# Patient Record
Sex: Female | Born: 1970 | Race: Black or African American | Hispanic: No | Marital: Single | Smoking: Current every day smoker
Health system: Southern US, Community
[De-identification: ages and names within clinical notes are randomized; demographics above are authoritative.]

## PROBLEM LIST (undated history)

## (undated) DIAGNOSIS — D219 Benign neoplasm of connective and other soft tissue, unspecified: Secondary | ICD-10-CM

## (undated) DIAGNOSIS — T50901A Poisoning by unspecified drugs, medicaments and biological substances, accidental (unintentional), initial encounter: Secondary | ICD-10-CM

## (undated) DIAGNOSIS — F319 Bipolar disorder, unspecified: Secondary | ICD-10-CM

## (undated) DIAGNOSIS — F32A Depression, unspecified: Secondary | ICD-10-CM

## (undated) DIAGNOSIS — F329 Major depressive disorder, single episode, unspecified: Secondary | ICD-10-CM

## (undated) DIAGNOSIS — F419 Anxiety disorder, unspecified: Secondary | ICD-10-CM

## (undated) DIAGNOSIS — N390 Urinary tract infection, site not specified: Secondary | ICD-10-CM

## (undated) HISTORY — DX: Benign neoplasm of connective and other soft tissue, unspecified: D21.9

---

## 2008-05-10 ENCOUNTER — Emergency Department (HOSPITAL_COMMUNITY): Admission: EM | Admit: 2008-05-10 | Discharge: 2008-05-10 | Payer: Self-pay | Admitting: Family Medicine

## 2008-07-15 ENCOUNTER — Emergency Department (HOSPITAL_COMMUNITY): Admission: EM | Admit: 2008-07-15 | Discharge: 2008-07-15 | Payer: Self-pay | Admitting: *Deleted

## 2011-04-29 LAB — POCT I-STAT, CHEM 8
Calcium, Ion: 1.14 mmol/L (ref 1.12–1.32)
Creatinine, Ser: 0.8 mg/dL (ref 0.4–1.2)
Glucose, Bld: 95 mg/dL (ref 70–99)
Hemoglobin: 15.3 g/dL — ABNORMAL HIGH (ref 12.0–15.0)
Potassium: 2.7 mEq/L — CL (ref 3.5–5.1)
Sodium: 142 mEq/L (ref 135–145)
TCO2: 22 mmol/L (ref 0–100)

## 2011-04-29 LAB — CBC
HCT: 43.5 % (ref 36.0–46.0)
Hemoglobin: 15.1 g/dL — ABNORMAL HIGH (ref 12.0–15.0)
MCHC: 34.6 g/dL (ref 30.0–36.0)
RBC: 4.49 MIL/uL (ref 3.87–5.11)
WBC: 7.3 10*3/uL (ref 4.0–10.5)

## 2011-04-29 LAB — POCT PREGNANCY, URINE: Preg Test, Ur: NEGATIVE

## 2011-04-29 LAB — SALICYLATE LEVEL: Salicylate Lvl: <4 mg/dL (ref 2.8–20.0)

## 2011-04-29 LAB — DIFFERENTIAL
Basophils Relative: 1 % (ref 0–1)
Lymphs Abs: 1.9 10*3/uL (ref 0.7–4.0)
Monocytes Absolute: 0.6 10*3/uL (ref 0.1–1.0)
Monocytes Relative: 8 % (ref 3–12)
Neutro Abs: 4.7 10*3/uL (ref 1.7–7.7)

## 2011-04-29 LAB — RAPID URINE DRUG SCREEN, HOSP PERFORMED
Amphetamines: NOT DETECTED
Barbiturates: NOT DETECTED
Benzodiazepines: NOT DETECTED
Opiates: NOT DETECTED

## 2011-04-29 LAB — ACETAMINOPHEN LEVEL: Acetaminophen (Tylenol), Serum: <10 ug/mL — ABNORMAL LOW (ref 10–30)

## 2011-04-29 LAB — ETHANOL: Alcohol, Ethyl (B): <5 mg/dL (ref 0–10)

## 2011-04-29 LAB — TRICYCLICS SCREEN, URINE: TCA Scrn: NOT DETECTED

## 2011-11-03 ENCOUNTER — Encounter (HOSPITAL_COMMUNITY): Payer: Self-pay | Admitting: Obstetrics and Gynecology

## 2011-11-03 ENCOUNTER — Inpatient Hospital Stay (HOSPITAL_COMMUNITY)
Admission: AD | Admit: 2011-11-03 | Discharge: 2011-11-03 | Disposition: A | Discharge status: Home / Self Care | Payer: Self-pay | Source: Ambulatory Visit | Attending: Obstetrics and Gynecology | Admitting: Obstetrics and Gynecology

## 2011-11-03 ENCOUNTER — Inpatient Hospital Stay (HOSPITAL_COMMUNITY): Payer: Self-pay

## 2011-11-03 DIAGNOSIS — A599 Trichomoniasis, unspecified: Secondary | ICD-10-CM

## 2011-11-03 DIAGNOSIS — A5901 Trichomonal vulvovaginitis: Secondary | ICD-10-CM | POA: Insufficient documentation

## 2011-11-03 DIAGNOSIS — N938 Other specified abnormal uterine and vaginal bleeding: Secondary | ICD-10-CM | POA: Insufficient documentation

## 2011-11-03 DIAGNOSIS — N390 Urinary tract infection, site not specified: Secondary | ICD-10-CM | POA: Insufficient documentation

## 2011-11-03 DIAGNOSIS — N949 Unspecified condition associated with female genital organs and menstrual cycle: Secondary | ICD-10-CM | POA: Insufficient documentation

## 2011-11-03 HISTORY — DX: Anxiety disorder, unspecified: F41.9

## 2011-11-03 HISTORY — DX: Urinary tract infection, site not specified: N39.0

## 2011-11-03 HISTORY — DX: Poisoning by unspecified drugs, medicaments and biological substances, accidental (unintentional), initial encounter: T50.901A

## 2011-11-03 HISTORY — DX: Bipolar disorder, unspecified: F31.9

## 2011-11-03 HISTORY — DX: Depression, unspecified: F32.A

## 2011-11-03 HISTORY — DX: Major depressive disorder, single episode, unspecified: F32.9

## 2011-11-03 LAB — WET PREP, GENITAL

## 2011-11-03 LAB — CBC
MCHC: 34 g/dL (ref 30.0–36.0)
MCV: 95.2 fL (ref 78.0–100.0)
WBC: 9.3 10*3/uL (ref 4.0–10.5)

## 2011-11-03 LAB — URINE MICROSCOPIC-ADD ON

## 2011-11-03 LAB — POCT PREGNANCY, URINE: Preg Test, Ur: NEGATIVE

## 2011-11-03 LAB — URINALYSIS, ROUTINE W REFLEX MICROSCOPIC
Bilirubin Urine: NEGATIVE
Glucose, UA: NEGATIVE mg/dL
Nitrite: POSITIVE — AB
Urobilinogen, UA: 2 mg/dL — ABNORMAL HIGH (ref 0.0–1.0)

## 2011-11-03 MED ORDER — METRONIDAZOLE 500 MG PO TABS: 500.0000 mg | ORAL_TABLET | Freq: Two times a day (BID) | ORAL | Status: AC

## 2011-11-03 MED ORDER — NITROFURANTOIN MONOHYD MACRO 100 MG PO CAPS: 100.0000 mg | ORAL_CAPSULE | Freq: Two times a day (BID) | ORAL | Status: AC

## 2011-11-03 NOTE — MAU Provider Note (Signed)
History     CSN: [id number]  Arrival date and time: 11/03/11 1450   None     Chief Complaint  Patient presents with  . Dysmenorrhea   HPI Pt is not pregnant and complains of heavy vaginal bleeding for 10 days since 10/24/2011.She has had RCM until now.  She has been changing tampon or pad every 20 mintues to one hour. She has clots. She has a new partner for 3 months.  She had cramping at the beginning but now has back and side pain.  She denies nausea or vomiting.  She denies pain with urination, constipation or diarrhea.  She has Implanon that she has had a long time.  She has been feeling tired and weak.  She has not taken any medications for this.  She is on Seraquel prescribed by [facility].    Past Medical History  Diagnosis Date  . UTI (lower urinary tract infection)   . Bipolar 1 disorder   . Anxiety   . Depression   . Overdose drug   . Genital herpes     Past Surgical History  Procedure Date  . Cesarean section     History reviewed. No pertinent family history.  History  Substance Use Topics  . Smoking status: Current Everyday Smoker  . Smokeless tobacco: Not on file  . Alcohol Use: No    Allergies:  Allergies  Allergen Reactions  . Sulfa Antibiotics Shortness Of Breath and Swelling    Throat swells up    Prescriptions prior to admission  Medication Sig Dispense Refill  . benztropine (COGENTIN) 1 MG tablet Take 1 mg by mouth 2 (two) times daily as needed.      Marland Kitchen QUEtiapine (SEROQUEL) 400 MG tablet Take 400 mg by mouth at bedtime.        Review of Systems  Constitutional: Negative for fever and chills.  Gastrointestinal: Positive for abdominal pain. Negative for nausea, vomiting, diarrhea and constipation.  Genitourinary: Negative for dysuria and urgency.   Physical Exam   Blood pressure 133/94, pulse 97, temperature 98.8 F (37.1 C), temperature source Oral, resp. rate 16, height 5' 2.5" (1.588 m), weight 179 lb 2 oz (81.251 kg), last  menstrual period 10/24/2011.  Physical Exam  Nursing note and vitals reviewed. Constitutional: She appears well-developed and well-nourished.  Eyes: Pupils are equal, round, and reactive to light.  Neck: Normal range of motion. Neck supple.  Respiratory: Effort normal.  GI: Soft. She exhibits no distension. There is no tenderness. There is no rebound and no guarding.  Genitourinary:       Small amount of froth bloody discharge in vault; cervix clean NT; uterus NSSC; adnexa with out palpable enlargement or tenderness  Musculoskeletal: Normal range of motion.  Neurological: She is alert.  Skin: Skin is warm and dry.  Psychiatric: She has a normal mood and affect.    MAU Course  Procedures Results for orders placed during the hospital encounter of 11/03/11 (from the past 24 hour(s))  WET PREP, GENITAL     Status: Abnormal   Collection Time   11/03/11  6:45 PM      Component Value Range   Yeast Wet Prep HPF POC NONE SEEN  NONE SEEN    Trich, Wet Prep FEW (*) NONE SEEN    Clue Cells Wet Prep HPF POC NONE SEEN  NONE SEEN    WBC, Wet Prep HPF POC FEW (*) NONE SEEN   GC/CHLAMYDIA PROBE AMP, GENITAL  Status: Normal   Collection Time   11/03/11  6:45 PM      Component Value Range   GC Probe Amp, Genital NEGATIVE  NEGATIVE    Chlamydia, DNA Probe NEGATIVE  NEGATIVE   Technique: Both transabdominal and transvaginal ultrasound  examinations of the pelvis were performed. Transabdominal technique  was performed for global imaging of the pelvis including uterus,  ovaries, adnexal regions, and pelvic cul-de-sac.  Comparison: None.  It was necessary to proceed with endovaginal exam following the  transabdominal exam to visualize the endometrium and uterus.  Findings:  Uterus: Retroverted uterus is present measuring 82 mm x 43 mm x 61  mm. The uterus is fibroid with a left posterior fundal fibroid  measuring 12 mm x 9 mm x 13 mm.  Endometrium: 4 mm.  Right ovary: 29 mm x 80 mm x 23 mm  with normal physiologic  echotexture.  Left ovary: 38 mm x 24 mm x 22 mm with normal physiologic  echotexture.  Other findings: Small amount of free fluid is present in the  pelvis. This appears simple.  IMPRESSION:  1. Fibroid uterus. Left posterior fundal fibroid maximally  measures 13 mm.  2. Small amount of pelvic free fluid can be associated with  abdominal pain.  Original Report Authenticated By: Andreas Newport, M.D.   Assessment and Plan   Trich - Flagyl BID for 5 days UTI-Macrobid BID for 7 days F/u in GYN clinic for AUB and removal of Implanon Joselynn Amoroso 11/03/2011, 5:29 PM

## 2011-11-03 NOTE — ED Notes (Signed)
Pt not in lobby.  

## 2011-11-03 NOTE — MAU Note (Signed)
Pt states had been bleeding x10 days, notes bilateral flank pain, moreso on her left side. Has seen small blood clots, unsure if they are coming from her bladder or vagina. Wearing tampon at present. Was changing tampon q20 minutes, will seem to subside, then worsen again. Denies abnormal vaginal d/c changes.

## 2011-11-04 LAB — GC/CHLAMYDIA PROBE AMP, GENITAL: GC Probe Amp, Genital: NEGATIVE

## 2011-11-14 NOTE — MAU Provider Note (Signed)
Agree with above note.  Christine Wyatt 11/14/2011 9:43 AM

## 2011-12-12 ENCOUNTER — Encounter: Payer: Self-pay | Admitting: Family

## 2014-02-13 ENCOUNTER — Emergency Department (HOSPITAL_COMMUNITY)
Admission: EM | Admit: 2014-02-13 | Discharge: 2014-02-14 | Disposition: A | Discharge status: Home / Self Care | Payer: Self-pay | Attending: Emergency Medicine | Admitting: Emergency Medicine

## 2014-02-13 ENCOUNTER — Encounter (HOSPITAL_COMMUNITY): Payer: Self-pay | Admitting: Emergency Medicine

## 2014-02-13 DIAGNOSIS — F319 Bipolar disorder, unspecified: Secondary | ICD-10-CM | POA: Insufficient documentation

## 2014-02-13 DIAGNOSIS — N939 Abnormal uterine and vaginal bleeding, unspecified: Secondary | ICD-10-CM

## 2014-02-13 DIAGNOSIS — F17200 Nicotine dependence, unspecified, uncomplicated: Secondary | ICD-10-CM | POA: Insufficient documentation

## 2014-02-13 DIAGNOSIS — Z79899 Other long term (current) drug therapy: Secondary | ICD-10-CM | POA: Insufficient documentation

## 2014-02-13 DIAGNOSIS — N390 Urinary tract infection, site not specified: Secondary | ICD-10-CM | POA: Insufficient documentation

## 2014-02-13 DIAGNOSIS — Z3202 Encounter for pregnancy test, result negative: Secondary | ICD-10-CM | POA: Insufficient documentation

## 2014-02-13 DIAGNOSIS — N898 Other specified noninflammatory disorders of vagina: Secondary | ICD-10-CM | POA: Insufficient documentation

## 2014-02-13 DIAGNOSIS — Z8619 Personal history of other infectious and parasitic diseases: Secondary | ICD-10-CM | POA: Insufficient documentation

## 2014-02-13 LAB — BASIC METABOLIC PANEL
ANION GAP: 15 (ref 5–15)
BUN: 8 mg/dL (ref 6–23)
CHLORIDE: 100 meq/L (ref 96–112)
CO2: 23 meq/L (ref 19–32)
CREATININE: 0.6 mg/dL (ref 0.50–1.10)
Calcium: 9.8 mg/dL (ref 8.4–10.5)
GFR calc Af Amer: >90 mL/min (ref >90)
GFR calc non Af Amer: >90 mL/min (ref >90)
Glucose, Bld: 89 mg/dL (ref 70–99)
Potassium: 4.2 mEq/L (ref 3.7–5.3)
Sodium: 138 mEq/L (ref 137–147)

## 2014-02-13 LAB — URINE MICROSCOPIC-ADD ON

## 2014-02-13 LAB — URINALYSIS, ROUTINE W REFLEX MICROSCOPIC
BILIRUBIN URINE: NEGATIVE
Glucose, UA: NEGATIVE mg/dL
KETONES UR: 15 mg/dL — AB
Nitrite: POSITIVE — AB
PH: 6.5 (ref 5.0–8.0)
Protein, ur: NEGATIVE mg/dL
SPECIFIC GRAVITY, URINE: 1.022 (ref 1.005–1.030)
UROBILINOGEN UA: 1 mg/dL (ref 0.0–1.0)

## 2014-02-13 LAB — I-STAT CHEM 8, ED
BUN: 9 mg/dL (ref 6–23)
Calcium, Ion: 1.03 mmol/L — ABNORMAL LOW (ref 1.12–1.23)
Chloride: 108 mEq/L (ref 96–112)
Creatinine, Ser: 0.7 mg/dL (ref 0.50–1.10)
GLUCOSE: 91 mg/dL (ref 70–99)
HCT: 44 % (ref 36.0–46.0)
HEMOGLOBIN: 15 g/dL (ref 12.0–15.0)
POTASSIUM: 7.1 meq/L — AB (ref 3.7–5.3)
Sodium: 136 mEq/L — ABNORMAL LOW (ref 137–147)
TCO2: 26 mmol/L (ref 0–100)

## 2014-02-13 LAB — POC URINE PREG, ED: Preg Test, Ur: NEGATIVE

## 2014-02-13 LAB — WET PREP, GENITAL
Trich, Wet Prep: NONE SEEN
Yeast Wet Prep HPF POC: NONE SEEN

## 2014-02-13 MED ORDER — CEPHALEXIN 500 MG PO CAPS: 500.0000 mg | ORAL_CAPSULE | Freq: Two times a day (BID) | ORAL | Status: DC

## 2014-02-13 NOTE — ED Provider Notes (Signed)
CSN: [id number]     Arrival date & time 02/13/14  1938 History   First MD Initiated Contact with Patient 02/13/14 2045     Chief Complaint  Patient presents with  . Vaginal Bleeding     (Consider location/radiation/quality/duration/timing/severity/associated sxs/prior Treatment) HPI Comments: Patient is a 43 year old female presenting today with a chief complaint of vaginal bleeding.  She reports that on 01/29/14 she started her menstrual cycle.  She states that the vaginal bleeding has continued since that time.  Her normal menstrual cycle typically lasts 5 days.  She reports that she is currently changing a tampon every 2-3 hours.  She reports that she has noticed blood clots.  She states that she has had similar symptoms in the past and that her menstrual cycle has been irregular over the past 6 months.  She is currently not on any contraceptives.  She reports associated lower abdominal cramping.  She has not taken anything for the cramping.  She denies fever, chills, nausea, vomiting, diarrhea, constipation, or urinary symptoms.  Denies vaginal discharge.  Denies dizziness or lightheadedness. She denies any history of bleeding disorders and is currently not on any anticoagulants.    The history is provided by the patient.    Past Medical History  Diagnosis Date  . UTI (lower urinary tract infection)   . Bipolar 1 disorder   . Anxiety   . Depression   . Overdose drug   . Genital herpes    Past Surgical History  Procedure Laterality Date  . Cesarean section     No family history on file. History  Substance Use Topics  . Smoking status: Current Every Day Smoker  . Smokeless tobacco: Not on file  . Alcohol Use: No   OB History   Grav Para Term Preterm Abortions TAB SAB Ect Mult Living   2 1 1  0 1 1 0 0 0 1     Review of Systems  Genitourinary: Positive for vaginal bleeding.  All other systems reviewed and are negative.     Allergies  Sulfa antibiotics  Home Medications    Prior to Admission medications   Medication Sig Start Date End Date Taking? Authorizing Provider  divalproex (DEPAKOTE) 500 MG DR tablet Take 1,000 mg by mouth at bedtime.   Yes Historical Provider, MD  QUEtiapine (SEROQUEL XR) 400 MG 24 hr tablet Take 400 mg by mouth at bedtime.   Yes Historical Provider, MD   BP 121/70  Pulse 82  Temp(Src) 98.4 F (36.9 C) (Oral)  Resp 18  SpO2 100%  LMP 01/29/2014 Physical Exam  Nursing note and vitals reviewed. Constitutional: She appears well-developed and well-nourished.  HENT:  Head: Normocephalic and atraumatic.  Mouth/Throat: Oropharynx is clear and moist.  Neck: Normal range of motion. Neck supple.  Cardiovascular: Normal rate, regular rhythm and normal heart sounds.   Pulmonary/Chest: Effort normal and breath sounds normal.  Abdominal: Soft. Bowel sounds are normal. She exhibits no distension and no mass. There is no rebound and no guarding.  Genitourinary: Cervix exhibits no motion tenderness. Right adnexum displays no mass, no tenderness and no fullness. Left adnexum displays no mass, no tenderness and no fullness.  Small amount of blood in the vaginal vault  Neurological: She is alert.  Skin: Skin is warm and dry.  Psychiatric: She has a normal mood and affect.    ED Course  Procedures (including critical care time) Labs Review Labs Reviewed  WET PREP, GENITAL - Abnormal; Notable for the  following:    Clue Cells Wet Prep HPF POC RARE (*)    WBC, Wet Prep HPF POC FEW (*)    All other components within normal limits  URINALYSIS, ROUTINE W REFLEX MICROSCOPIC - Abnormal; Notable for the following:    APPearance CLOUDY (*)    Hgb urine dipstick SMALL (*)    Ketones, ur 15 (*)    Nitrite POSITIVE (*)    Leukocytes, UA SMALL (*)    All other components within normal limits  URINE MICROSCOPIC-ADD ON - Abnormal; Notable for the following:    Squamous Epithelial / LPF FEW (*)    Bacteria, UA MANY (*)    All other components  within normal limits  I-STAT CHEM 8, ED - Abnormal; Notable for the following:    Sodium 136 (*)    Potassium 7.1 (*)    Calcium, Ion 1.03 (*)    All other components within normal limits  GC/CHLAMYDIA PROBE AMP  URINE CULTURE  BASIC METABOLIC PANEL  POC URINE PREG, ED    Imaging Review No results found.   EKG Interpretation None      MDM   Final diagnoses:  None   Patient is a 43 year old female presenting today with vaginal bleeding that has been present since 01/29/14.  VSS.  Hemoglobin today is 15.0.  Initial istat Potassium was 7.1.  BMP was then performed, which showed a Potassium of 4.1.  Therefore, feel that the istat K was incorrect.  Urine pregnancy was negative.  No pain with pelvic exam.  UA showing UTI.  Patient started on antibiotic and urine cultured.  Irregular menstrual cycle may be a result of the patient being perimenopausal.  Patient stable for discharge.  Patient instructed to follow up with Gynecology.  Return precautions given.      Hyman Bible, PA-C 02/15/14 [place], PA-C 02/15/14 2330

## 2014-02-13 NOTE — ED Notes (Addendum)
Pt reports having spotting since her menstrual cycle started on 01/29/2014. Pt reports that in the beginning it started as heavy vaginal bleeding, however it has decreased and now is just spotting. Pt denies noticing any discharge. Pt reports cramping/menstrual pain since 01/29/2014. Pt also reports lower back pain. Pt is A/O x4, vitals are WDL, and pt is in NAD.

## 2014-02-13 NOTE — ED Notes (Signed)
Emelda Brothers, H. PA made aware of patient Chem 8 results.

## 2014-02-14 LAB — GC/CHLAMYDIA PROBE AMP
CT Probe RNA: NEGATIVE
GC Probe RNA: NEGATIVE

## 2014-02-14 MED ORDER — CEPHALEXIN 500 MG PO CAPS
500.0000 mg | ORAL_CAPSULE | Freq: Once | ORAL | Status: AC
  Administered 2014-02-14: 500 mg via ORAL
  Filled 2014-02-14: qty 1

## 2014-02-15 LAB — URINE CULTURE: Colony Count: >=100000

## 2014-02-16 ENCOUNTER — Telehealth (HOSPITAL_BASED_OUTPATIENT_CLINIC_OR_DEPARTMENT_OTHER): Payer: Self-pay | Admitting: Emergency Medicine

## 2014-02-16 NOTE — Telephone Encounter (Signed)
Post ED Visit - Positive Culture Follow-up  Culture report reviewed by antimicrobial stewardship pharmacist: []  [place], Pharm.D., BCPS []  Heide Guile, Pharm.D., BCPS []  Alycia Rossetti, Pharm.D., BCPS []  [place], Pharm.D., BCPS, AAHIVP [x]  Legrand Como, Pharm.D., BCPS, AAHIVP  Positive urine culture Treated with Keflex, organism sensitive to the same and no further patient follow-up is required at this time.  Myrna Blazer 02/16/2014, 4:54 PM

## 2014-02-16 NOTE — ED Provider Notes (Signed)
Medical screening examination/treatment/procedure(s) were performed by non-physician practitioner and as supervising physician I was immediately available for consultation/collaboration.   EKG Interpretation None       Leota Jacobsen, MD 02/16/14 1526

## 2014-05-26 ENCOUNTER — Encounter (HOSPITAL_COMMUNITY): Payer: Self-pay | Admitting: Emergency Medicine

## 2015-09-26 ENCOUNTER — Encounter (HOSPITAL_COMMUNITY): Payer: Self-pay | Admitting: Emergency Medicine

## 2015-09-26 ENCOUNTER — Emergency Department (HOSPITAL_COMMUNITY)
Admission: EM | Admit: 2015-09-26 | Discharge: 2015-09-26 | Disposition: A | Discharge status: Home / Self Care | Payer: Self-pay | Attending: Emergency Medicine | Admitting: Emergency Medicine

## 2015-09-26 DIAGNOSIS — K002 Abnormalities of size and form of teeth: Secondary | ICD-10-CM | POA: Insufficient documentation

## 2015-09-26 DIAGNOSIS — Z8619 Personal history of other infectious and parasitic diseases: Secondary | ICD-10-CM | POA: Insufficient documentation

## 2015-09-26 DIAGNOSIS — Z79899 Other long term (current) drug therapy: Secondary | ICD-10-CM | POA: Insufficient documentation

## 2015-09-26 DIAGNOSIS — K047 Periapical abscess without sinus: Secondary | ICD-10-CM | POA: Insufficient documentation

## 2015-09-26 DIAGNOSIS — F319 Bipolar disorder, unspecified: Secondary | ICD-10-CM | POA: Insufficient documentation

## 2015-09-26 DIAGNOSIS — K0381 Cracked tooth: Secondary | ICD-10-CM | POA: Insufficient documentation

## 2015-09-26 DIAGNOSIS — Z792 Long term (current) use of antibiotics: Secondary | ICD-10-CM | POA: Insufficient documentation

## 2015-09-26 DIAGNOSIS — Z87440 Personal history of urinary (tract) infections: Secondary | ICD-10-CM | POA: Insufficient documentation

## 2015-09-26 MED ORDER — TRAMADOL HCL 50 MG PO TABS: 50.0000 mg | ORAL_TABLET | Freq: Four times a day (QID) | ORAL | Status: DC | PRN

## 2015-09-26 MED ORDER — PENICILLIN V POTASSIUM 500 MG PO TABS: 500.0000 mg | ORAL_TABLET | Freq: Four times a day (QID) | ORAL | Status: DC

## 2015-09-26 MED ORDER — IBUPROFEN 800 MG PO TABS: 800.0000 mg | ORAL_TABLET | Freq: Three times a day (TID) | ORAL | Status: DC | PRN

## 2015-09-26 MED ORDER — TRAMADOL HCL 50 MG PO TABS
50.0000 mg | ORAL_TABLET | Freq: Once | ORAL | Status: AC
  Administered 2015-09-26: 50 mg via ORAL
  Filled 2015-09-26: qty 1

## 2015-09-26 NOTE — ED Notes (Signed)
Declined W/C at D/C and was escorted to lobby by RN. 

## 2015-09-26 NOTE — ED Provider Notes (Signed)
CSN: [id number]     Arrival date & time 09/26/15  1258 History  By signing my name below, I, Soijett Blue, attest that this documentation has been prepared under the direction and in the presence of Irena Cords, [employer] Electronically Signed: Soijett Blue, ED Scribe. 09/26/2015. 1:20 PM.   Chief Complaint  Patient presents with  . Dental Pain      The history is provided by the patient. No language interpreter was used.    Christine Wyatt is a 45 y.o. female who presents to the Emergency Department complaining of gradually worsening left upper and lower dental pain onset 3 days. She notes that she has a tooth that is cracked and she hasn't had any pain for awhile, until recently. Pt denies having a dentist at this time. She states that she is having associated symptoms of left sided facial swelling. She states that she has tried orajel and ibuprofen with no relief for her symptoms. She denies fever, chills, and any other symptoms.   Past Medical History  Diagnosis Date  . UTI (lower urinary tract infection)   . Bipolar 1 disorder ([place])   . Anxiety   . Depression   . Overdose drug   . Genital herpes    Past Surgical History  Procedure Laterality Date  . Cesarean section     No family history on file. Social History  Substance Use Topics  . Smoking status: Current Every Day Smoker  . Smokeless tobacco: None  . Alcohol Use: No   OB History    Gravida Para Term Preterm AB TAB SAB Ectopic Multiple Living   2 1 1  0 1 1 0 0 0 1     Review of Systems  Constitutional: Negative for fever and chills.  HENT: Positive for dental problem and facial swelling. Negative for sore throat and trouble swallowing.       Allergies  Sulfa antibiotics  Home Medications   Prior to Admission medications   Medication Sig Start Date End Date Taking? Authorizing Provider  cephALEXin (KEFLEX) 500 MG capsule Take 1 capsule (500 mg total) by mouth 2 (two) times daily. 02/13/14   Heather  Laisure, PA-C  divalproex (DEPAKOTE) 500 MG DR tablet Take 1,000 mg by mouth at bedtime.    Historical Provider, MD  QUEtiapine (SEROQUEL XR) 400 MG 24 hr tablet Take 400 mg by mouth at bedtime.    Historical Provider, MD   BP 159/105 mmHg  Pulse 86  Temp(Src) 99.3 F (37.4 C) (Oral)  Resp 18  Ht 5\' 2"  (1.575 m)  Wt 189 lb 7 oz (85.928 kg)  BMI 34.64 kg/m2  SpO2 95%  LMP 09/26/2015 (Exact Date) Physical Exam  Constitutional: She is oriented to person, place, and time. She appears well-developed and well-nourished. No distress.  HENT:  Head: Normocephalic and atraumatic.  Mouth/Throat: Uvula is midline, oropharynx is clear and moist and mucous membranes are normal. Abnormal dentition.  Multiple decayed teeth on the left upper and lower dentition. No identifiable abscess. Swelling of left cheek. Floor of the mouth not swollen. No neck swelling noted.   Eyes: EOM are normal.  Neck: Neck supple.  Cardiovascular: Normal rate.   Pulmonary/Chest: Effort normal. No respiratory distress.  Musculoskeletal: Normal range of motion.  Neurological: She is alert and oriented to person, place, and time.  Skin: Skin is warm and dry.  Psychiatric: She has a normal mood and affect. Her behavior is normal.  Nursing note and vitals reviewed.   ED  Course  Procedures (including critical care time) DIAGNOSTIC STUDIES: Oxygen Saturation is 95% on RA, adequate by my interpretation.    COORDINATION OF CARE: 1:20 PM Discussed treatment plan with pt at bedside which includes referral to dentist and pt agreed to plan.   Patient be treated for dental abscess.  She has multiple decayed teeth.  Told to return here as needed.  Patient agrees the plan and all questions were answered   Dalia Heading, PA-C 09/26/15 [place], MD 09/27/15 [phone]

## 2015-09-26 NOTE — Discharge Instructions (Signed)
Return here as needed. Follow up with the resources provided.  [facility] [employer] is a great source of information about community services available.  Access by dialing 2-1-1 from anywhere in [place], or by website -  CustodianSupply.fi.   Other Local Resources (Updated 07/2015)  Dental  Care   Services    Phone Number and Address  Cost  [facility] For children 52 - 47 years of age:   Cleaning  Tooth brushing/flossing instruction  Sealants, fillings, crowns  Extractions  Emergency treatment  [phone] [address], [place] [address] Charges based on family income.  Medicaid and some insurance plans accepted.     [facility] - [facility], fillings, crowns  Extractions  Emergency treatment [phone] [address], [place]  Pregnant women 1 years of age or older with a Medicaid card  [facility] - [place]  Cleaning  Sealants, fillings, crowns  Extractions  Emergency treatment [phone] [address] [place], [place] Pregnant women 62 years of age or older with a Medicaid card  [facility] For children 39 - 39 years of age:   Cleaning  Tooth brushing/flossing instruction  Sealants, fillings, crowns  Extractions  Emergency treatment Limited orthodontic services for patients with Medicaid [phone] [address], [place] [address] Medicaid and [facility] Health Choice cover for children up to age 74 and pregnant women.  Parents of children up to age 45 without Medicaid pay a reduced fee at time of service.  [place] For children 59 - 97 years of age:   Cleaning  Tooth brushing/flossing instruction  Sealants, fillings, crowns  Extractions  Emergency  treatment Limited orthodontic services for patients with Medicaid [phone] [place], [place].  Medicaid and [place] Health Choice cover for children up to age 38 and pregnant women.  Parents of children up to age 71 without Medicaid pay a reduced fee.  [facility]  Sealants, fillings, crowns  Extractions  Hours: Tuesdays and Thursdays, 4:15 - 8 pm [phone] [address], [place], [place] [address] Services free of charge to [facility] residents ages 18-64 who do not have health insurance, Medicare, [place], or [place] benefits and fall within federal poverty guidelines  [place] care in addition to primary medical care, nutritional counseling, and pharmacy:  Engineer, drilling, fillings, crowns  Extractions                  [phone] [facility], [place], [place], [place], [place], [facility], [place], [place] [facility] [place], [place], [place], most insurance.  Also provides services available to all with fees adjusted based on ability to pay.    [facility]  Cleaning  Tooth brushing/flossing instruction  Sealants, fillings, crowns  Extractions  Emergency treatment Hours: Tuesdays, Thursdays, and Fridays from 8 am to 5 pm by appointment only. [place], [place] [address] [facility] residents with Medicaid (depending on eligibility)  and children with [place] Health Choice - call for more information.  [facility]  Extractions only  Hours: 2nd and 4th Thursday of each month from  6:30 am - 9 am.   [phone] [address]. [address] [place], [place] [address] Ages 46 and older only.  Patients are seen on a first come, first served basis.  [employer]  [employer]  Extractions  Orthodontics  Endodontics  Implants/Crowns/Bridges  Complete and partial dentures [phone] [place], [place] Patients must complete an application for services.  There is often a waiting list.

## 2015-09-26 NOTE — ED Notes (Addendum)
Pt c/o toothache to left side of mouth. Pt has tried oragel, ibuprofen without relief.

## 2015-11-02 ENCOUNTER — Telehealth: Payer: Self-pay | Admitting: General Practice

## 2015-11-02 NOTE — Telephone Encounter (Signed)
APPT. REMINDER CALL, LMTCB °

## 2015-11-03 ENCOUNTER — Ambulatory Visit: Payer: Self-pay | Admitting: Internal Medicine

## 2015-11-10 ENCOUNTER — Ambulatory Visit (INDEPENDENT_AMBULATORY_CARE_PROVIDER_SITE_OTHER): Payer: Self-pay | Admitting: Internal Medicine

## 2015-11-10 ENCOUNTER — Encounter: Payer: Self-pay | Admitting: Internal Medicine

## 2015-11-10 ENCOUNTER — Other Ambulatory Visit: Payer: Self-pay

## 2015-11-10 VITALS — BP 108/84 | HR 86 | Temp 98.4°F | Ht 62.25 in | Wt 186.1 lb

## 2015-11-10 DIAGNOSIS — R1013 Epigastric pain: Secondary | ICD-10-CM | POA: Insufficient documentation

## 2015-11-10 DIAGNOSIS — D219 Benign neoplasm of connective and other soft tissue, unspecified: Secondary | ICD-10-CM | POA: Insufficient documentation

## 2015-11-10 DIAGNOSIS — F17210 Nicotine dependence, cigarettes, uncomplicated: Secondary | ICD-10-CM

## 2015-11-10 DIAGNOSIS — Z720 Tobacco use: Secondary | ICD-10-CM | POA: Insufficient documentation

## 2015-11-10 DIAGNOSIS — F319 Bipolar disorder, unspecified: Secondary | ICD-10-CM | POA: Insufficient documentation

## 2015-11-10 DIAGNOSIS — K0889 Other specified disorders of teeth and supporting structures: Secondary | ICD-10-CM

## 2015-11-10 DIAGNOSIS — N390 Urinary tract infection, site not specified: Secondary | ICD-10-CM | POA: Insufficient documentation

## 2015-11-10 DIAGNOSIS — Z0000 Encounter for general adult medical examination without abnormal findings: Secondary | ICD-10-CM | POA: Insufficient documentation

## 2015-11-10 HISTORY — DX: Benign neoplasm of connective and other soft tissue, unspecified: D21.9

## 2015-11-10 MED ORDER — PANTOPRAZOLE SODIUM 40 MG PO TBEC: 40.0000 mg | DELAYED_RELEASE_TABLET | Freq: Every day | ORAL | Status: AC

## 2015-11-10 NOTE — Assessment & Plan Note (Signed)
Pt c/o occasional epigastric pain unrelated to food or lying down.  Has taken ibuprofen for dental pain.  Likely gastritis. -protonix qd

## 2015-11-10 NOTE — Patient Instructions (Signed)
Thank you for your visit today.   Please return to the internal medicine clinic in about 1 month or sooner if needed.     I have made the following additions/changes to your medications:  You may have gastritis or inflammation of the stomach from ibuprofen use.  I sent a prescription to the pharmacy (protonix) to be taken daily before breakfast.  Please be sure to bring all of your medications with you to every visit; this includes herbal supplements, vitamins, eye drops, and any over-the-counter medications.   Should you have any questions regarding your medications and/or any new or worsening symptoms, please be sure to call the clinic at [phone].   If you believe that you are suffering from a life threatening condition or one that may result in the loss of limb or function, then you should call 911 and proceed to the nearest Emergency Department.   A healthy lifestyle and preventative care can promote health and wellness.   Maintain regular health, dental, and eye exams.  Eat a healthy diet. Foods like vegetables, fruits, whole grains, low-fat dairy products, and lean protein foods contain the nutrients you need without too many calories. Decrease your intake of foods high in solid fats, added sugars, and salt. Get information about a proper diet from your caregiver, if necessary.  Regular physical exercise is one of the most important things you can do for your health. Most adults should get at least 150 minutes of moderate-intensity exercise (any activity that increases your heart rate and causes you to sweat) each week. In addition, most adults need muscle-strengthening exercises on 2 or more days a week.   Maintain a healthy weight. The body mass index (BMI) is a screening tool to identify possible weight problems. It provides an estimate of body fat based on height and weight. Your caregiver can help determine your BMI, and can help you achieve or maintain a healthy weight. For  adults 20 years and older:  A BMI below 18.5 is considered underweight.  A BMI of 18.5 to 24.9 is normal.  A BMI of 25 to 29.9 is considered overweight.  A BMI of 30 and above is considered obese.  Gastritis, Adult Gastritis is soreness and puffiness (inflammation) of the lining of the stomach. If you do not get help, gastritis can cause bleeding and sores (ulcers) in the stomach. HOME CARE   Only take medicine as told by your doctor.  If you were given antibiotic medicines, take them as told. Finish the medicines even if you start to feel better.  Drink enough fluids to keep your pee (urine) clear or pale yellow.  Avoid foods and drinks that make your problems worse. Foods you may want to avoid include:  Caffeine or alcohol.  Chocolate.  Mint.  Garlic and onions.  Spicy foods.  Citrus fruits, including oranges, lemons, or limes.  Food containing tomatoes, including sauce, chili, salsa, and pizza.  Fried and fatty foods.  Eat small meals throughout the day instead of large meals. GET HELP RIGHT AWAY IF:   You have black or dark red poop (stools).  You throw up (vomit) blood. It may look like coffee grounds.  You cannot keep fluids down.  Your belly (abdominal) pain gets worse.  You have a fever.  You do not feel better after 1 week.  You have any other questions or concerns. MAKE SURE YOU:   Understand these instructions.  Will watch your condition.  Will get help right away if  you are not doing well or get worse.   This information is not intended to replace advice given to you by your health care provider. Make sure you discuss any questions you have with your health care provider.   Document Released: 12/28/2007 Document Revised: 10/03/2011 Document Reviewed: 08/24/2011 Elsevier Interactive Patient Education [employer].

## 2015-11-10 NOTE — Assessment & Plan Note (Signed)
Pt has a history of depression and bipolar disorder followed by Christine Wyatt which is also providing her with meds.  She is currently on seroquel and citalopram qd and sees them about every month.  -cont to f/u with [facility]

## 2015-11-10 NOTE — Telephone Encounter (Signed)
Good Rx card faxed to [facility] on [address], pharmacy will apply discount toward cost of medication. Message left on pt's recorder and card mailed to patient.  Phone call complete.Regenia Skeeter, Aluel Schwarz [place]4/18/20173:00 PM

## 2015-11-10 NOTE — Assessment & Plan Note (Signed)
Pt will need a pap smear as I was unable to locate on in EPIC.

## 2015-11-10 NOTE — Progress Notes (Signed)
Patient ID: Christine Wyatt, female   DOB: 15-May-1971, 45 y.o.   MRN: [id number]     Subjective:   Patient ID: Christine Wyatt female    DOB: Nov 20, 1970 45 y.o.    MRN: [id number] Health Maintenance Due: Health Maintenance Due  Topic Date Due  . HIV Screening  02/02/1984  . TETANUS/TDAP  02/02/1988  . PAP SMEAR  02/01/1990    _________________________________________________  HPI: Christine Wyatt is a 45 y.o. female here to establish care.  Pt has a PMH outlined below.  Please see problem-based charting assessment and plan for further status of patient's chronic medical problems addressed at today's visit.  PMH: Past Medical History  Diagnosis Date  . UTI (lower urinary tract infection)   . Bipolar 1 disorder ([place])   . Anxiety   . Depression   . Overdose drug   . Genital herpes     Medications: Current Outpatient Prescriptions on File Prior to Visit  Medication Sig Dispense Refill  . QUEtiapine (SEROQUEL XR) 400 MG 24 hr tablet Take 400 mg by mouth at bedtime.     No current facility-administered medications on file prior to visit.    Allergies: Allergies  Allergen Reactions  . Sulfa Antibiotics Shortness Of Breath and Swelling    Throat swells up    FH: Family History  Problem Relation Age of Onset  . Hypertension Mother     SH: Social History   Social History  . Marital Status: Single    Spouse Name: N/A  . Number of Children: N/A  . Years of Education: N/A   Social History Main Topics  . Smoking status: Current Every Day Smoker  . Smokeless tobacco: None  . Alcohol Use: No  . Drug Use: No  . 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 None   Social History Narrative    Review of Systems: Constitutional: Negative for fever, chills.  Eyes: Negative for blurred vision.  Respiratory: Negative for cough and  shortness of breath.  Cardiovascular: Negative for chest pain.  Gastrointestinal: Negative for nausea, vomiting, +abdominal pain.  Neurological: Negative for dizziness.   Objective:   Vital Signs: Filed Vitals:   11/10/15 0850  BP: 108/84  Pulse: 86  Temp: 98.4 F (36.9 C)  TempSrc: Oral  Height: 5' 2.25" (1.581 m)  Weight: 186 lb 1.6 oz (84.414 kg)  SpO2: 100%      BP Readings from Last 3 Encounters:  11/10/15 108/84  09/26/15 105/104  02/14/14 161/98    Physical Exam: Constitutional: Vital signs reviewed.  Patient is in NAD and cooperative with exam.  Head: Normocephalic and atraumatic. Eyes: EOMI, conjunctivae nl, no scleral icterus.  Neck: Supple, thyroid without nodules.  Cardiovascular: RRR, no MRG. Pulmonary/Chest: normal effort, CTAB, no wheezes, rales, or rhonchi. Abdominal: Soft. NT/ND +BS. Neurological: A&O x3, cranial nerves II-XII are grossly intact, moving all extremities. Extremities: No LE edema.  Skin: Warm, dry and intact. No rash.   Assessment & Plan:   Assessment and plan was discussed and formulated with my attending.

## 2015-11-10 NOTE — Telephone Encounter (Signed)
LVM for patient to return call. 

## 2015-11-10 NOTE — Telephone Encounter (Signed)
Returned call to patient-no answer, message left on recorder.Christine Hidden Cassady4/18/20172:41 PM    Of note, I contacted [facility] on [address] [phone]), and they stated they would honor the good rx price for pt's medication. Message was left on pt's recorder that she would need a good rx card for discounted price.  Will check with pharmacy to see if one could be faxed to pharmacy.

## 2015-11-10 NOTE — Assessment & Plan Note (Signed)
Pt smokes about 10cig/day since the age of 42 and is not interested in quitting today. -cont to counsel regarding smoking cessation

## 2015-11-10 NOTE — Telephone Encounter (Signed)
Please call pt back.

## 2015-11-10 NOTE — Progress Notes (Signed)
Case discussed with Dr. Gill soon after the resident saw the patient.  We reviewed the resident's history and exam and pertinent patient test results.  I agree with the assessment, diagnosis and plan of care documented in the resident's note. 

## 2015-11-10 NOTE — Assessment & Plan Note (Signed)
Pt continues to struggle with poor dentition.  She was in the ED in March with a dental abscess and was given abx.  She reports improvement but still has some dental pain -referral to dentistry

## 2015-11-23 ENCOUNTER — Telehealth: Payer: Self-pay | Admitting: General Practice

## 2015-11-23 NOTE — Telephone Encounter (Signed)
APPT. REMINDER CALL, LMTCB °

## 2015-11-24 ENCOUNTER — Ambulatory Visit: Payer: Self-pay

## 2017-05-23 ENCOUNTER — Inpatient Hospital Stay (HOSPITAL_COMMUNITY)
Admission: AD | Admit: 2017-05-23 | Discharge: 2017-05-23 | Disposition: A | Discharge status: Home / Self Care | Payer: Self-pay | Source: Ambulatory Visit | Attending: Obstetrics and Gynecology | Admitting: Obstetrics and Gynecology

## 2017-05-23 ENCOUNTER — Encounter (HOSPITAL_COMMUNITY): Payer: Self-pay | Admitting: *Deleted

## 2017-05-23 DIAGNOSIS — F17200 Nicotine dependence, unspecified, uncomplicated: Secondary | ICD-10-CM | POA: Insufficient documentation

## 2017-05-23 DIAGNOSIS — F419 Anxiety disorder, unspecified: Secondary | ICD-10-CM | POA: Insufficient documentation

## 2017-05-23 DIAGNOSIS — Z8619 Personal history of other infectious and parasitic diseases: Secondary | ICD-10-CM | POA: Insufficient documentation

## 2017-05-23 DIAGNOSIS — Z9889 Other specified postprocedural states: Secondary | ICD-10-CM | POA: Insufficient documentation

## 2017-05-23 DIAGNOSIS — Z882 Allergy status to sulfonamides status: Secondary | ICD-10-CM | POA: Insufficient documentation

## 2017-05-23 DIAGNOSIS — D500 Iron deficiency anemia secondary to blood loss (chronic): Secondary | ICD-10-CM | POA: Insufficient documentation

## 2017-05-23 DIAGNOSIS — N12 Tubulo-interstitial nephritis, not specified as acute or chronic: Secondary | ICD-10-CM

## 2017-05-23 DIAGNOSIS — D259 Leiomyoma of uterus, unspecified: Secondary | ICD-10-CM | POA: Insufficient documentation

## 2017-05-23 DIAGNOSIS — Z8249 Family history of ischemic heart disease and other diseases of the circulatory system: Secondary | ICD-10-CM | POA: Insufficient documentation

## 2017-05-23 DIAGNOSIS — N939 Abnormal uterine and vaginal bleeding, unspecified: Secondary | ICD-10-CM

## 2017-05-23 DIAGNOSIS — F319 Bipolar disorder, unspecified: Secondary | ICD-10-CM | POA: Insufficient documentation

## 2017-05-23 LAB — URINALYSIS, ROUTINE W REFLEX MICROSCOPIC
BILIRUBIN URINE: NEGATIVE
Glucose, UA: NEGATIVE mg/dL
KETONES UR: NEGATIVE mg/dL
Nitrite: POSITIVE — AB
PROTEIN: 100 mg/dL — AB
Specific Gravity, Urine: 1.026 (ref 1.005–1.030)
pH: 5 (ref 5.0–8.0)

## 2017-05-23 LAB — CBC
HCT: 29.1 % — ABNORMAL LOW (ref 36.0–46.0)
Hemoglobin: 9.4 g/dL — ABNORMAL LOW (ref 12.0–15.0)
MCH: 26.8 pg (ref 26.0–34.0)
MCHC: 32.3 g/dL (ref 30.0–36.0)
MCV: 82.9 fL (ref 78.0–100.0)
Platelets: 373 10*3/uL (ref 150–400)
RBC: 3.51 MIL/uL — ABNORMAL LOW (ref 3.87–5.11)
RDW: 14.9 % (ref 11.5–15.5)
WBC: 11.1 10*3/uL — ABNORMAL HIGH (ref 4.0–10.5)

## 2017-05-23 LAB — POCT PREGNANCY, URINE: PREG TEST UR: NEGATIVE

## 2017-05-23 LAB — WET PREP, GENITAL
CLUE CELLS WET PREP: NONE SEEN
Sperm: NONE SEEN
YEAST WET PREP: NONE SEEN

## 2017-05-23 MED ORDER — KETOROLAC TROMETHAMINE 60 MG/2ML IM SOLN
60.0000 mg | Freq: Once | INTRAMUSCULAR | Status: AC
  Administered 2017-05-23: 60 mg via INTRAMUSCULAR
  Filled 2017-05-23: qty 2

## 2017-05-23 MED ORDER — FERROUS SULFATE 325 (65 FE) MG PO TABS: 325.0000 mg | ORAL_TABLET | Freq: Every day | ORAL | 0 refills | Status: AC

## 2017-05-23 MED ORDER — CIPROFLOXACIN HCL 250 MG PO TABS: 250.0000 mg | ORAL_TABLET | Freq: Two times a day (BID) | ORAL | 0 refills | Status: AC

## 2017-05-23 MED ORDER — MEGESTROL ACETATE 20 MG PO TABS: 40.0000 mg | ORAL_TABLET | Freq: Two times a day (BID) | ORAL | 1 refills | Status: AC

## 2017-05-23 NOTE — Discharge Instructions (Signed)
Abnormal Uterine Bleeding Abnormal uterine bleeding means bleeding more than usual from your uterus. It can include:  Bleeding between periods.  Bleeding after sex.  Bleeding that is heavier than normal.  Periods that last longer than usual.  Bleeding after you have stopped having your period (menopause).  There are many problems that may cause this. You should see a doctor for any kind of bleeding that is not normal. Treatment depends on the cause of the bleeding. Follow these instructions at home:  Watch your condition for any changes.  Do not use tampons, douche, or have sex, if your doctor tells you not to.  Change your pads often.  Get regular well-woman exams. Make sure they include a pelvic exam and cervical cancer screening.  Keep all follow-up visits as told by your doctor. This is important. Contact a doctor if:  The bleeding lasts more than one week.  You feel dizzy at times.  You feel like you are going to throw up (nauseous).  You throw up. Get help right away if:  You pass out.  You have to change pads every hour.  You have belly (abdominal) pain.  You have a fever.  You get sweaty.  You get weak.  You passing large blood clots from your vagina. Summary  Abnormal uterine bleeding means bleeding more than usual from your uterus.  There are many problems that may cause this. You should see a doctor for any kind of bleeding that is not normal.  Treatment depends on the cause of the bleeding. This information is not intended to replace advice given to you by your health care provider. Make sure you discuss any questions you have with your health care provider. Document Released: 05/08/2009 Document Revised: 07/05/2016 Document Reviewed: 07/05/2016 Elsevier Interactive Patient Education  2017 Elsevier Inc.  Anemia, Nonspecific Anemia is a condition in which the concentration of red blood cells or hemoglobin in the blood is below normal.  Hemoglobin is a substance in red blood cells that carries oxygen to the tissues of the body. Anemia results in not enough oxygen reaching these tissues. What are the causes? Common causes of anemia include:  Excessive bleeding. Bleeding may be internal or external. This includes excessive bleeding from periods (in women) or from the intestine.  Poor nutrition.  Chronic kidney, thyroid, and liver disease.  Bone marrow disorders that decrease red blood cell production.  Cancer and treatments for cancer.  HIV, AIDS, and their treatments.  Spleen problems that increase red blood cell destruction.  Blood disorders.  Excess destruction of red blood cells due to infection, medicines, and autoimmune disorders.  What are the signs or symptoms?  Minor weakness.  Dizziness.  Headache.  Palpitations.  Shortness of breath, especially with exercise.  Paleness.  Cold sensitivity.  Indigestion.  Nausea.  Difficulty sleeping.  Difficulty concentrating. Symptoms may occur suddenly or they may develop slowly. How is this diagnosed? Additional blood tests are often needed. These help your health care provider determine the best treatment. Your health care provider will check your stool for blood and look for other causes of blood loss. How is this treated? Treatment varies depending on the cause of the anemia. Treatment can include:  Supplements of iron, vitamin Z66, or folic acid.  Hormone medicines.  A blood transfusion. This may be needed if blood loss is severe.  Hospitalization. This may be needed if there is significant continual blood loss.  Dietary changes.  Spleen removal.  Follow these instructions at home:  Keep all follow-up appointments. It often takes many weeks to correct anemia, and having your health care provider check on your condition and your response to treatment is very important. Get help right away if:  You develop extreme weakness, shortness of  breath, or chest pain.  You become dizzy or have trouble concentrating.  You develop heavy vaginal bleeding.  You develop a rash.  You have bloody or black, tarry stools.  You faint.  You vomit up blood.  You vomit repeatedly.  You have abdominal pain.  You have a fever or persistent symptoms for more than 2-3 days.  You have a fever and your symptoms suddenly get worse.  You are dehydrated. This information is not intended to replace advice given to you by your health care provider. Make sure you discuss any questions you have with your health care provider. Document Released: 08/18/2004 Document Revised: 12/23/2015 Document Reviewed: 01/04/2013 Elsevier Interactive Patient Education  2017 Elsevier Inc.  Pyelonephritis, Adult Pyelonephritis is a kidney infection. The kidneys are organs that help clean your blood by moving waste out of your blood and into your pee (urine). This infection can happen quickly, or it can last for a long time. In most cases, it clears up with treatment and does not cause other problems. Follow these instructions at home: Medicines Take over-the-counter and prescription medicines only as told by your doctor. Take your antibiotic medicine as told by your doctor. Do not stop taking the medicine even if you start to feel better. General instructions Drink enough fluid to keep your pee clear or pale yellow. Avoid caffeine, tea, and carbonated drinks. Pee (urinate) often. Avoid holding in pee for long periods of time. Pee before and after sex. After pooping (having a bowel movement), women should wipe from front to back. Use each tissue only once. Keep all follow-up visits as told by your doctor. This is important. Contact a doctor if: You do not feel better after 2 days. Your symptoms get worse. You have a fever. Get help right away if: You cannot take your medicine or drink fluids as told. You have chills and shaking. You throw up (vomit). You  have very bad pain in your side (flank) or back. You feel very weak or you pass out (faint). This information is not intended to replace advice given to you by your health care provider. Make sure you discuss any questions you have with your health care provider. Document Released: 08/18/2004 Document Revised: 12/17/2015 Document Reviewed: 11/03/2014 Elsevier Interactive Patient Education  [employer].

## 2017-05-23 NOTE — MAU Note (Signed)
+  vaginal bleeding Since sept 24  States feels weak  +lower abdominal pain Rating pain  Cramping and sharp at times 10/10

## 2017-05-23 NOTE — MAU Provider Note (Signed)
History     CSN: [id number]  Arrival date and time: 05/23/17 1422   First Provider Initiated Contact with Patient 05/23/17 1638      Chief Complaint  Patient presents with  . Vaginal Bleeding   HPI [name] is a 46 y/o G57P1011 female who presents with increased vaginal bleeding since 04/17/17. She reports that she has to use multiple pads and tampons per day and that she soaks through them, sometimes experiencing bleeding down her legs when she stands up. She reports passing multiple large clots per day that she describes as dark red and 3 inches in size. She states that her periods have been irregular for the last year and she missed her period in August, however they typically last for 7 days with a normal amount of bleeding. She reports a cramping and sharp lower abdominal pain and lower back pain that she rates as a 10/10 in severity. She has not tried taking anything for the pain. She also reports pelvic and vaginal pain since the bleeding started and an odor when she uses the bathroom that began on Sunday. She reports dizziness, fatigue, weakness, decreased appetite, headache and tachycardia. She denies changes in weight, urinary frequency, urinary urgency, dysuria, dyspareunia, diarrhea, or constipation. She has a history of uterine fibroids which were diagnosed in 2013, and history of genital herpes and trichomonas infection.  She is currently sexually active with 1 partner for the last "few months", sometimes uses condoms, and has had 3 sexual partners in the past year. She has a history of bipolar disorder and anxiety for which she was being followed at [facility], however stopped her Seroquel and has not followed up with them for 6 months due to transportation issues.  Pertinent Gynecological History: Menses: Irregular, sometimes skipping a month, but lasts for 7 days when it occurs Bleeding: abnormal uterine bleeding, irregular, currently heavy bleeding since 04/17/17 Contraception:  Condoms sometimes DES exposure: unknown Blood transfusions: unknown Sexually transmitted diseases: past history: herpes and trichomonas  Previous GYN Procedures: none  Last mammogram: N/A Date: N/A Last pap: unknown Date: unknown   Past Medical History:  Diagnosis Date  . Anxiety   . Bipolar 1 disorder ([place])   . Depression   . Fibroids 11/10/2015  . Genital herpes   . Overdose drug   . UTI (lower urinary tract infection)     Past Surgical History:  Procedure Laterality Date  . CESAREAN SECTION      Family History  Problem Relation Age of Onset  . Hypertension Mother     Social History  Substance Use Topics  . Smoking status: Current Every Day Smoker  . Smokeless tobacco: Never Used  . Alcohol use No    Allergies:  Allergies  Allergen Reactions  . Sulfa Antibiotics Shortness Of Breath and Swelling    Throat swells up    Prescriptions Prior to Admission  Medication Sig Dispense Refill Last Dose  . pantoprazole (PROTONIX) 40 MG tablet Take 1 tablet (40 mg total) by mouth daily. (Patient not taking: Reported on 05/23/2017) 30 tablet 0 Not Taking at Unknown time    Review of Systems  Constitutional: Positive for appetite change (decrease) and fatigue. Negative for chills, fever and unexpected weight change.  Respiratory: Negative for chest tightness and shortness of breath.   Cardiovascular: Negative for chest pain.  Gastrointestinal: Positive for abdominal pain, nausea and vomiting. Negative for constipation and diarrhea.  Genitourinary: Positive for menstrual problem, pelvic pain, vaginal bleeding and vaginal pain. Negative  for dyspareunia, dysuria, frequency and urgency.  Musculoskeletal: Positive for back pain.  Neurological: Positive for weakness and light-headedness. Negative for headaches.   Physical Exam   Blood pressure (!) 160/94, pulse (!) 109, temperature 98.5 F (36.9 C), temperature source Oral, resp. rate 18, weight 85.7 kg (189 lb 0.6 oz), SpO2  99 %.  Physical Exam  Constitutional: She is oriented to person, place, and time. She appears well-developed and well-nourished.  HENT:  Head: Normocephalic and atraumatic.  Cardiovascular: Normal rate and regular rhythm.   Respiratory: Effort normal and breath sounds normal.  GI: Soft. Bowel sounds are normal. She exhibits no mass. There is tenderness in the right lower quadrant, suprapubic area and left lower quadrant. There is CVA tenderness (Mild left sided). There is no rebound and no guarding.  Genitourinary: Uterus normal. Cervix exhibits discharge (dark red blood with multiple large clots). Cervix exhibits no motion tenderness. Right adnexum displays no mass, no tenderness and no fullness. Left adnexum displays no mass, no tenderness and no fullness. There is bleeding in the vagina. No erythema in the vagina. No foreign body in the vagina. No vaginal discharge found.  Neurological: She is alert and oriented to person, place, and time.  Skin: Skin is warm and dry.  Psychiatric: She has a normal mood and affect. Her behavior is normal. Thought content normal. Her speech is tangential.    MAU Course  Procedures  MDM Pelvic exam - revealed dark red blood with multiple large clots GC/CHL probe Wet prep - negative Urinalysis - positive nitrites, moderate leukocytes, rare bacteria suggestive of UTI CBC - elevated WBC, low RBC, Hgb and Hct suggestive of anemia   Assessment and Plan  1. Abnormal uterine bleeding - rx'd Megace 40mg  BID, follow-up US in 1 week to evaluate for uterine fibroids given the patients history of fibroids. Follow-up in clinic in 2-3 weeks to evaluate abnormal uterine bleeding 2. UTI - possible pyelonephritis given the patients mildly elevated white count and left sided CVA tenderness. Ciprofloxacin 250 mg BID for 7 days. 3. Anemia due to abnormal uterine bleeding - based on decreased RBC, Hgb, and Hct. Rx'd ferrous sulfate 325 mg once daily.  Rodell Perna,  PA-S 05/23/2017, 5:39 PM

## 2017-05-23 NOTE — MAU Provider Note (Signed)
History     CSN: [id number]  Arrival date and time: 05/23/17 1422   First Provider Initiated Contact with Patient 05/23/17 1638      Chief Complaint  Patient presents with  . Vaginal Bleeding   HPI  [name] is a 46 y.o. G2P1011 who presents to MAU today with complaint of vaginal bleeding since 04/17/17. The patient states bleeding is heavy daily. She has been passing large clots most days as well. She rates lower abdominal pain at 10/10. She has had some low back pain as well. She denies UTI symptoms or fever. She is not taken anything for pain. She is sexually active with 1 partner for the last few months. She has a history of a small uterine fibroid diagnosed in 2013 and has not had follow-up with a GYN since then. She states that prior to this episode of bleeding she had a normal period in July and then no bleeding in August. She has had irregular periods in the past.    OB History    Gravida Para Term Preterm AB Living   2 1 1  0 1 1   SAB TAB Ectopic Multiple Live Births   0 1 0 0        Past Medical History:  Diagnosis Date  . Anxiety   . Bipolar 1 disorder ([place])   . Depression   . Fibroids 11/10/2015  . Genital herpes   . Overdose drug   . UTI (lower urinary tract infection)     Past Surgical History:  Procedure Laterality Date  . CESAREAN SECTION      Family History  Problem Relation Age of Onset  . Hypertension Mother     Social History  Substance Use Topics  . Smoking status: Current Every Day Smoker  . Smokeless tobacco: Never Used  . Alcohol use No    Allergies:  Allergies  Allergen Reactions  . Sulfa Antibiotics Shortness Of Breath and Swelling    Throat swells up    Prescriptions Prior to Admission  Medication Sig Dispense Refill Last Dose  . pantoprazole (PROTONIX) 40 MG tablet Take 1 tablet (40 mg total) by mouth daily. (Patient not taking: Reported on 05/23/2017) 30 tablet 0 Not Taking at Unknown time    Review of  Systems  Constitutional: Negative for fever.  Gastrointestinal: Positive for abdominal pain. Negative for constipation, diarrhea, nausea and vomiting.  Genitourinary: Positive for vaginal bleeding. Negative for dysuria, flank pain, frequency, urgency and vaginal discharge.  Musculoskeletal: Positive for back pain.   Physical Exam   Blood pressure (!) 152/94, pulse 99, temperature 98.8 F (37.1 C), temperature source Oral, resp. rate 20, weight 189 lb 0.6 oz (85.7 kg), SpO2 99 %.  Physical Exam  Nursing note and vitals reviewed. Constitutional: She is oriented to person, place, and time. She appears well-developed and well-nourished. No distress.  HENT:  Head: Normocephalic and atraumatic.  Cardiovascular: Tachycardia present.   Respiratory: Effort normal.  GI: Soft. She exhibits no distension and no mass. There is no tenderness. There is CVA tenderness (mild, left). There is no rebound and no guarding.  Genitourinary: Uterus is not enlarged and not tender. Cervix exhibits no motion tenderness, no discharge and no friability. Right adnexum displays no mass and no tenderness. Left adnexum displays no mass and no tenderness. There is bleeding (small) in the vagina. No vaginal discharge found.  Neurological: She is alert and oriented to person, place, and time.  Skin: Skin is warm and  dry. No erythema.  Psychiatric: She has a normal mood and affect.   Results for orders placed or performed during the hospital encounter of 05/23/17 (from the past 24 hour(s))  Urinalysis, Routine w reflex microscopic     Status: Abnormal   Collection Time: 05/23/17  2:49 PM  Result Value Ref Range   Color, Urine YELLOW YELLOW   APPearance HAZY (A) CLEAR   Specific Gravity, Urine 1.026 1.005 - 1.030   pH 5.0 5.0 - 8.0   Glucose, UA NEGATIVE NEGATIVE mg/dL   Hgb urine dipstick MODERATE (A) NEGATIVE   Bilirubin Urine NEGATIVE NEGATIVE   Ketones, ur NEGATIVE NEGATIVE mg/dL   Protein, ur 100 (A) NEGATIVE  mg/dL   Nitrite POSITIVE (A) NEGATIVE   Leukocytes, UA MODERATE (A) NEGATIVE   RBC / HPF 6-30 0 - 5 RBC/hpf   WBC, UA 6-30 0 - 5 WBC/hpf   Bacteria, UA RARE (A) NONE SEEN   Squamous Epithelial / LPF 0-5 (A) NONE SEEN   Mucus PRESENT   CBC     Status: Abnormal   Collection Time: 05/23/17  3:12 PM  Result Value Ref Range   WBC 11.1 (H) 4.0 - 10.5 K/uL   RBC 3.51 (L) 3.87 - 5.11 MIL/uL   Hemoglobin 9.4 (L) 12.0 - 15.0 g/dL   HCT 29.1 (L) 36.0 - 46.0 %   MCV 82.9 78.0 - 100.0 fL   MCH 26.8 26.0 - 34.0 pg   MCHC 32.3 30.0 - 36.0 g/dL   RDW 14.9 11.5 - 15.5 %   Platelets 373 150 - 400 K/uL  Pregnancy, urine POC     Status: None   Collection Time: 05/23/17  4:31 PM  Result Value Ref Range   Preg Test, Ur NEGATIVE NEGATIVE  Wet prep, genital     Status: Abnormal   Collection Time: 05/23/17  5:35 PM  Result Value Ref Range   Yeast Wet Prep HPF POC NONE SEEN NONE SEEN   Trich, Wet Prep SPECIMEN UNACCEPTABLE, TEST NOT PERFORMED (A) NONE SEEN   Clue Cells Wet Prep HPF POC NONE SEEN NONE SEEN   WBC, Wet Prep HPF POC FEW (A) NONE SEEN   Sperm NONE SEEN    Orthostatic VS for the past 24 hrs:  BP- Lying Pulse- Lying BP- Sitting Pulse- Sitting BP- Standing at 0 minutes Pulse- Standing at 0 minutes  05/23/17 1622 - - - - (!) 139/91 100  05/23/17 1621 - - (!) 145/99 99 - -  05/23/17 1618 (!) 156/93 103 - - - -      MAU Course  Procedures None  MDM UA, wet prep, GC/Chlamydia and CBC today  Mild anemia noted, patient will be treated as outpatient Urine culture ordered 60 mg IM Toradol given - Patient states significant improvement in pain  Assessment and Plan  A:  Pyelonephritis  Abnormal uterine bleeding  Anemia   CHTN   P:  Discharge home Rx for Megace, Cipro and Ferrous Sulfate given to patient  Bleeding precautions discussed Outpatient [place] ordered Patient advised to follow-up with [facility] for further work-up for AUB Warning signs for worsening condition discussed Patient  referred to [place] for elevated blood pressure this week. Discussed need for emergent follow-up at [facility] if patient develops headache or chest pain Patient may return to MAU as needed or if her condition were to change or worsen  Kerry Hough, PA-C 05/23/2017, 6:51 PM

## 2017-05-23 NOTE — MAU Note (Signed)
Pt presents with c/o VB since 04/17/2017.  Reports passing large clots.   States has periods when she's light headed and dizzy.  Pt reports having abdominal pain, menstrual type cramps.

## 2017-05-25 LAB — GC/CHLAMYDIA PROBE AMP (~~LOC~~) NOT AT ARMC
Chlamydia: NEGATIVE
Neisseria Gonorrhea: NEGATIVE

## 2017-05-26 LAB — URINE CULTURE: Culture: >=100000 — AB

## 2017-05-30 ENCOUNTER — Ambulatory Visit (HOSPITAL_COMMUNITY)
Admission: RE | Admit: 2017-05-30 | Discharge: 2017-05-30 | Disposition: A | Discharge status: Home / Self Care | Payer: Self-pay | Source: Ambulatory Visit | Attending: Medical | Admitting: Medical

## 2017-05-30 DIAGNOSIS — N939 Abnormal uterine and vaginal bleeding, unspecified: Secondary | ICD-10-CM | POA: Insufficient documentation

## 2017-06-02 ENCOUNTER — Encounter: Payer: Self-pay | Admitting: General Practice

## 2017-06-26 ENCOUNTER — Ambulatory Visit: Payer: Self-pay | Admitting: Family Medicine

## 2017-06-26 ENCOUNTER — Encounter: Payer: Self-pay | Admitting: *Deleted

## 2017-06-26 ENCOUNTER — Ambulatory Visit (INDEPENDENT_AMBULATORY_CARE_PROVIDER_SITE_OTHER): Payer: Self-pay | Admitting: Family Medicine

## 2017-06-26 VITALS — BP 128/76 | HR 84 | Wt 189.9 lb

## 2017-06-26 DIAGNOSIS — N3000 Acute cystitis without hematuria: Secondary | ICD-10-CM

## 2017-06-26 DIAGNOSIS — D251 Intramural leiomyoma of uterus: Secondary | ICD-10-CM

## 2017-06-26 DIAGNOSIS — N939 Abnormal uterine and vaginal bleeding, unspecified: Secondary | ICD-10-CM

## 2017-06-26 DIAGNOSIS — F3289 Other specified depressive episodes: Secondary | ICD-10-CM

## 2017-06-26 LAB — POCT URINALYSIS DIP (DEVICE)
Glucose, UA: NEGATIVE mg/dL
KETONES UR: 15 mg/dL — AB
Nitrite: NEGATIVE
Protein, ur: 30 mg/dL — AB
Urobilinogen, UA: 2 mg/dL — ABNORMAL HIGH (ref 0.0–1.0)
pH: 6 (ref 5.0–8.0)

## 2017-06-26 NOTE — Progress Notes (Signed)
   Subjective:    Patient ID: Christine Wyatt, female    DOB: [birth year], 46 y.o.   MRN: [id number]  HPI Patient seen for AUB - seen in MAU, prescribed Megace - 40mg  BID. Stopped having bleeding when she started the medication. Still having cramping. Never had this before.  Also given antibiotic for UTI - was on cipro x 7 days. Urine culture positive for E coli - cipro sensitive.  I have reviewed the patients past medical, family, and social history.  I have reviewed the patient's medication list and allergies.   Review of Systems     Objective:   Physical Exam  Constitutional: She is oriented to person, place, and time. She appears well-developed and well-nourished.  HENT:  Head: Normocephalic and atraumatic.  Neck: Normal range of motion. Neck supple.  Cardiovascular: Normal rate, regular rhythm and normal heart sounds.  Pulmonary/Chest: Effort normal and breath sounds normal. No respiratory distress. She has no wheezes. She has no rales.  Abdominal: Soft. Bowel sounds are normal. She exhibits no distension. There is no tenderness. There is no rebound and no guarding.  Neurological: She is alert and oriented to person, place, and time.  Psychiatric: She has a normal mood and affect. Her behavior is normal. Judgment and thought content normal.      Assessment & Plan:  1. Abnormal uterine bleeding (AUB) Will try to decrease to 40mg  once a day. Patient self-pay, but applying for financial assistance. Will return for biopsy after financial assistance received.  2. Fibroids, intramural Likely source of bleeding  3. Other depression Declined meeting with integrative behavioral health.  4. Acute cystitis without hematuria Pt requested UA today - normal

## 2017-06-26 NOTE — Progress Notes (Signed)
Still taking Megace. HAs occ cramping but currently no vag bleeding

## 2017-06-27 ENCOUNTER — Other Ambulatory Visit: Payer: Self-pay | Admitting: Family Medicine

## 2017-06-27 DIAGNOSIS — Z1231 Encounter for screening mammogram for malignant neoplasm of breast: Secondary | ICD-10-CM

## 2018-06-12 ENCOUNTER — Encounter (HOSPITAL_COMMUNITY): Payer: Self-pay | Admitting: Emergency Medicine

## 2018-06-12 ENCOUNTER — Emergency Department (HOSPITAL_COMMUNITY)
Admission: EM | Admit: 2018-06-12 | Discharge: 2018-06-12 | Disposition: A | Discharge status: Home / Self Care | Payer: Self-pay | Attending: Emergency Medicine | Admitting: Emergency Medicine

## 2018-06-12 DIAGNOSIS — F17200 Nicotine dependence, unspecified, uncomplicated: Secondary | ICD-10-CM | POA: Insufficient documentation

## 2018-06-12 DIAGNOSIS — F419 Anxiety disorder, unspecified: Secondary | ICD-10-CM | POA: Insufficient documentation

## 2018-06-12 DIAGNOSIS — Z79899 Other long term (current) drug therapy: Secondary | ICD-10-CM | POA: Insufficient documentation

## 2018-06-12 DIAGNOSIS — F319 Bipolar disorder, unspecified: Secondary | ICD-10-CM | POA: Insufficient documentation

## 2018-06-12 LAB — CBC
HEMATOCRIT: 40.2 % (ref 36.0–46.0)
Hemoglobin: 12.6 g/dL (ref 12.0–15.0)
MCH: 26.7 pg (ref 26.0–34.0)
MCHC: 31.3 g/dL (ref 30.0–36.0)
MCV: 85.2 fL (ref 80.0–100.0)
Platelets: 344 10*3/uL (ref 150–400)
RBC: 4.72 MIL/uL (ref 3.87–5.11)
RDW: 15 % (ref 11.5–15.5)
WBC: 9.4 10*3/uL (ref 4.0–10.5)
nRBC: 0 % (ref 0.0–0.2)

## 2018-06-12 LAB — RAPID URINE DRUG SCREEN, HOSP PERFORMED
Amphetamines: NOT DETECTED
BENZODIAZEPINES: NOT DETECTED
Barbiturates: NOT DETECTED
Cocaine: NOT DETECTED
OPIATES: NOT DETECTED
Tetrahydrocannabinol: POSITIVE — AB

## 2018-06-12 LAB — COMPREHENSIVE METABOLIC PANEL
ALT: 11 U/L (ref 0–44)
AST: 21 U/L (ref 15–41)
Albumin: 4.6 g/dL (ref 3.5–5.0)
Alkaline Phosphatase: 73 U/L (ref 38–126)
Anion gap: 10 (ref 5–15)
BILIRUBIN TOTAL: 1.4 mg/dL — AB (ref 0.3–1.2)
BUN: 7 mg/dL (ref 6–20)
CO2: 23 mmol/L (ref 22–32)
Calcium: 9.6 mg/dL (ref 8.9–10.3)
Chloride: 105 mmol/L (ref 98–111)
Creatinine, Ser: 0.7 mg/dL (ref 0.44–1.00)
Glucose, Bld: 101 mg/dL — ABNORMAL HIGH (ref 70–99)
POTASSIUM: 3.7 mmol/L (ref 3.5–5.1)
Sodium: 138 mmol/L (ref 135–145)
TOTAL PROTEIN: 8.3 g/dL — AB (ref 6.5–8.1)

## 2018-06-12 LAB — PREGNANCY, URINE: Preg Test, Ur: NEGATIVE

## 2018-06-12 LAB — TSH: TSH: 1.02 u[IU]/mL (ref 0.350–4.500)

## 2018-06-12 LAB — ETHANOL

## 2018-06-12 MED ORDER — HYDROXYZINE HCL 25 MG PO TABS
25.0000 mg | ORAL_TABLET | Freq: Once | ORAL | Status: AC
  Administered 2018-06-12: 25 mg via ORAL
  Filled 2018-06-12: qty 1

## 2018-06-12 NOTE — ED Notes (Signed)
Pt made aware urine needed. Pt provided with cup

## 2018-06-12 NOTE — ED Notes (Addendum)
Patient alert and oriented saying she is scared.  Patient reports she used to be on seroquel but has not taken it in a long time.  Patient tearful at this time.  Denies SI, HI, and AVH.
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
## 2018-06-12 NOTE — ED Triage Notes (Addendum)
Per pt, states she has been waking up sweating and shaking for the past week-states she hasn't been taking her meds for her fibroid tumor-states she has also been off her psych meds, follows up with [facility]-states she doesn't know which meds are causing her symptoms

## 2018-06-12 NOTE — ED Notes (Signed)
Bed: WTR6 Expected date:  Expected time:  Means of arrival:  Comments: 

## 2018-06-12 NOTE — ED Notes (Signed)
Pt changed into burgundy scrubs. Belongings collected

## 2018-06-12 NOTE — ED Notes (Signed)
Bed: [facility] Expected date:  Expected time:  Means of arrival:  Comments: Room 18

## 2018-06-12 NOTE — ED Notes (Signed)
Pt ambulated to restroom without difficulty

## 2018-06-12 NOTE — ED Notes (Signed)
MD aware of pt endorsement of SI

## 2018-06-12 NOTE — ED Notes (Signed)
Bed: [facility] Expected date:  Expected time:  Means of arrival:  Comments: 

## 2018-06-12 NOTE — ED Provider Notes (Addendum)
[facility] DEPT Provider Note   CSN: [id number] Arrival date & time: 06/12/18  0920     History   Chief Complaint Chief Complaint  Patient presents with  . sweating/shakes  . off meds    HPI Christine Wyatt is a 47 y.o. female.  Patient with hx bipolar disorder, anxiety, fibroids/anemia, indicates has been feeling very stressed and worried lately. Also notes feelings of depression. Denies specific inciting or stressful event - states has money issues that make getting meds and medical care difficult. Patient notes symptoms presents x months, and off meds for 1+ year.  States at times feels as if body is shaking all over. Denies recent wt loss or wt gain. Normal appetite. Denies fevers. Denies etoh or substance abuse.      Past Medical History:  Diagnosis Date  . Anxiety   . Bipolar 1 disorder ([place])   . Depression   . Fibroids 11/10/2015  . Genital herpes   . Overdose drug   . UTI (lower urinary tract infection)     Patient Active Problem List   Diagnosis Date Noted  . Bipolar 1 disorder, depressed ([place]) 11/10/2015  . Tobacco abuse 11/10/2015  . Fibroids 11/10/2015  . Tooth pain 11/10/2015  . Epigastric pain 11/10/2015  . Health care maintenance 11/10/2015    Past Surgical History:  Procedure Laterality Date  . CESAREAN SECTION       OB History    Gravida  2   Para  1   Term  1   Preterm  0   AB  1   Living  1     SAB  0   TAB  1   Ectopic  0   Multiple  0   Live Births               Home Medications    Prior to Admission medications   Medication Sig Start Date End Date Taking? Authorizing Provider  ciprofloxacin (CIPRO) 250 MG tablet Take 1 tablet (250 mg total) by mouth every 12 (twelve) hours. Patient not taking: Reported on 06/26/2017 05/23/17   Luvenia Redden, PA-C  ferrous sulfate 325 (65 FE) MG tablet Take 1 tablet (325 mg total) by mouth daily. 05/23/17   Luvenia Redden, PA-C  megestrol  (MEGACE) 20 MG tablet Take 2 tablets (40 mg total) by mouth 2 (two) times daily. 05/23/17   Luvenia Redden, PA-C  pantoprazole (PROTONIX) 40 MG tablet Take 1 tablet (40 mg total) by mouth daily. Patient not taking: Reported on 05/23/2017 11/10/15   Jones Bales, MD    Family History Family History  Problem Relation Age of Onset  . Hypertension Mother     Social History Social History   Tobacco Use  . Smoking status: Current Every Day Smoker  . Smokeless tobacco: Never Used  Substance Use Topics  . Alcohol use: No  . Drug use: No     Allergies   Sulfa antibiotics   Review of Systems Review of Systems  Constitutional: Negative for fever.  HENT: Negative for sore throat.   Eyes: Negative for redness.  Respiratory: Negative for cough and shortness of breath.   Cardiovascular: Negative for chest pain.  Gastrointestinal: Negative for abdominal pain, diarrhea and vomiting.  Genitourinary: Negative for dysuria and flank pain.  Musculoskeletal: Negative for back pain.  Skin: Negative for rash.  Neurological: Negative for headaches.  Hematological: Does not bruise/bleed easily.  Psychiatric/Behavioral: Negative for confusion.  Physical Exam Updated Vital Signs BP (!) 142/110 (BP Location: Left Arm) Comment: off meds  Pulse 82   Temp 99.3 F (37.4 C)   Resp 18   SpO2 100%   Physical Exam  Constitutional: She appears well-developed and well-nourished.  HENT:  Head: Atraumatic.  Mouth/Throat: Oropharynx is clear and moist.  Eyes: Pupils are equal, round, and reactive to light. Conjunctivae are normal. No scleral icterus.  Neck: Neck supple. No tracheal deviation present. No thyromegaly present.  Cardiovascular: Normal rate, regular rhythm, normal heart sounds and intact distal pulses. Exam reveals no gallop and no friction rub.  No murmur heard. Pulmonary/Chest: Effort normal and breath sounds normal. No respiratory distress.  Abdominal: Soft. Normal  appearance and bowel sounds are normal. She exhibits no distension. There is no tenderness.  Genitourinary:  Genitourinary Comments: No cva tenderness.   Musculoskeletal: She exhibits no edema or tenderness.  Neurological: She is alert.  Speech clear/fluent. Motor/sens grossly intact bil. Steady gait. No tremor or shaking.   Skin: Skin is warm and dry. No rash noted.  Psychiatric:  Depressed mood. Denies SI.   Nursing note and vitals reviewed.    ED Treatments / Results  Labs (all labs ordered are listed, but only abnormal results are displayed) Results for orders placed or performed during the hospital encounter of 06/12/18  CBC  Result Value Ref Range   WBC 9.4 4.0 - 10.5 K/uL   RBC 4.72 3.87 - 5.11 MIL/uL   Hemoglobin 12.6 12.0 - 15.0 g/dL   HCT 40.2 36.0 - 46.0 %   MCV 85.2 80.0 - 100.0 fL   MCH 26.7 26.0 - 34.0 pg   MCHC 31.3 30.0 - 36.0 g/dL   RDW 15.0 11.5 - 15.5 %   Platelets 344 150 - 400 K/uL   nRBC 0.0 0.0 - 0.2 %  Comprehensive metabolic panel  Result Value Ref Range   Sodium 138 135 - 145 mmol/L   Potassium 3.7 3.5 - 5.1 mmol/L   Chloride 105 98 - 111 mmol/L   CO2 23 22 - 32 mmol/L   Glucose, Bld 101 (H) 70 - 99 mg/dL   BUN 7 6 - 20 mg/dL   Creatinine, Ser 0.70 0.44 - 1.00 mg/dL   Calcium 9.6 8.9 - 10.3 mg/dL   Total Protein 8.3 (H) 6.5 - 8.1 g/dL   Albumin 4.6 3.5 - 5.0 g/dL   AST 21 15 - 41 U/L   ALT 11 0 - 44 U/L   Alkaline Phosphatase 73 38 - 126 U/L   Total Bilirubin 1.4 (H) 0.3 - 1.2 mg/dL   GFR calc non Af Amer >60 >60 mL/min   GFR calc Af Amer >60 >60 mL/min   Anion gap 10 5 - 15  Ethanol  Result Value Ref Range   Alcohol, Ethyl (B) <10 <10 mg/dL  Rapid urine drug screen (hospital performed)  Result Value Ref Range   Opiates NONE DETECTED NONE DETECTED   Cocaine NONE DETECTED NONE DETECTED   Benzodiazepines NONE DETECTED NONE DETECTED   Amphetamines NONE DETECTED NONE DETECTED   Tetrahydrocannabinol POSITIVE (A) NONE DETECTED    Barbiturates NONE DETECTED NONE DETECTED  Pregnancy, urine  Result Value Ref Range   Preg Test, Ur NEGATIVE NEGATIVE  TSH  Result Value Ref Range   TSH 1.020 0.350 - 4.500 uIU/mL   EKG None  Radiology No results found.  Procedures Procedures (including critical care time)  Medications Ordered in ED Medications - No data to display  Initial Impression / Assessment and Plan / ED Course  I have reviewed the triage vital signs and the nursing notes.  Pertinent labs & imaging results that were available during my care of the patient were reviewed by me and considered in my medical decision making (see chart for details).  Labs sent.   [place] team consulted.  Reviewed nursing notes and prior charts for additional history.   Labs reviewed - chem normal.   BH eval pending.   Disposition per [facility] team.    Final Clinical Impressions(s) / ED Diagnoses   Final diagnoses:  None    ED Discharge Orders    None           Lajean Saver, MD 06/12/18 1236

## 2018-06-12 NOTE — ED Notes (Signed)
Pt endorsing SI. MD to be made aware.

## 2018-06-12 NOTE — ED Notes (Signed)
Report provided to [facility]

## 2018-06-14 ENCOUNTER — Ambulatory Visit: Payer: Self-pay

## 2018-08-05 IMAGING — US US PELVIS COMPLETE TRANSABD/TRANSVAG
1 series · 15 of 25 positions shown · non-contrast
Comparison: For [DATE]

CLINICAL DATA: Abnormal uterine bleeding for 2 months

EXAM:
TRANSABDOMINAL AND TRANSVAGINAL ULTRASOUND OF PELVIS
TECHNIQUE: Both transabdominal and transvaginal ultrasound examinations of the
pelvis were performed. Transabdominal technique was performed for
global imaging of the pelvis including uterus, ovaries, adnexal
regions, and pelvic cul-de-sac. It was necessary to proceed with
endovaginal exam following the transabdominal exam to visualize the
endometrium, uterus, and ovaries.

[Series 1: us pelvis complete transabd/transvag · 15 of 119 slices shown]
[im 1/119]
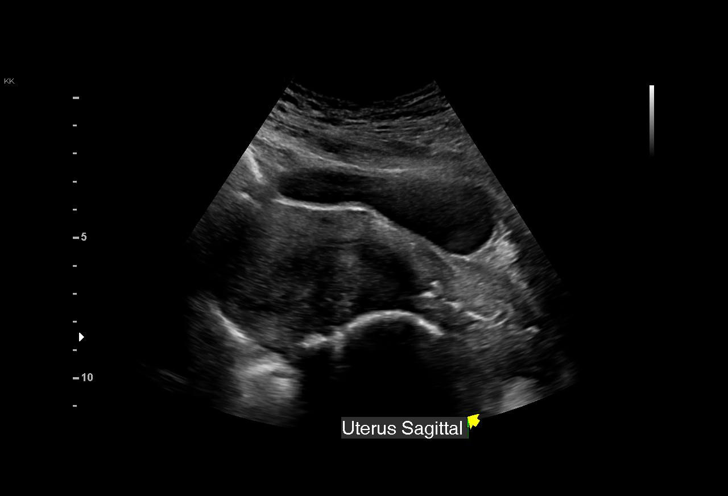
[im 10/119]
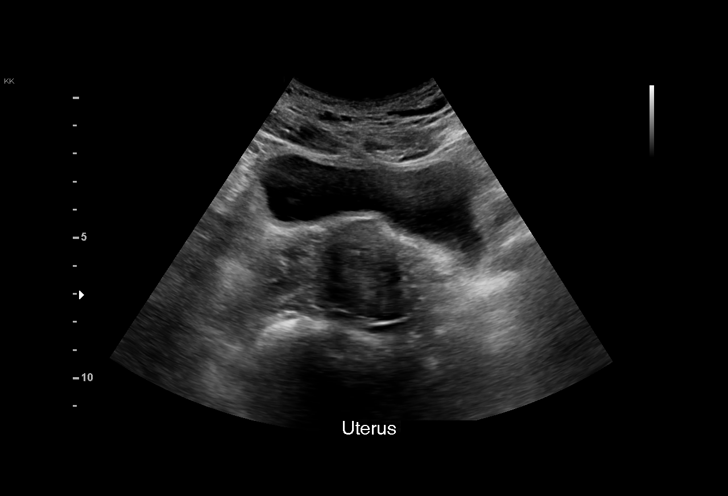
[im 20/119]
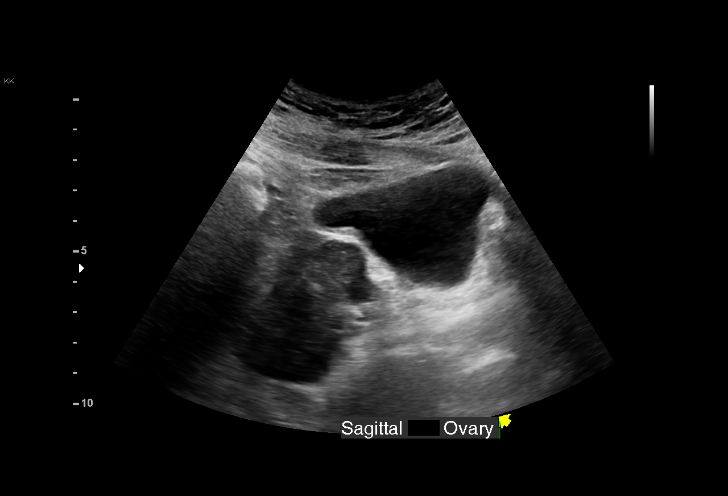
[im 25/119]
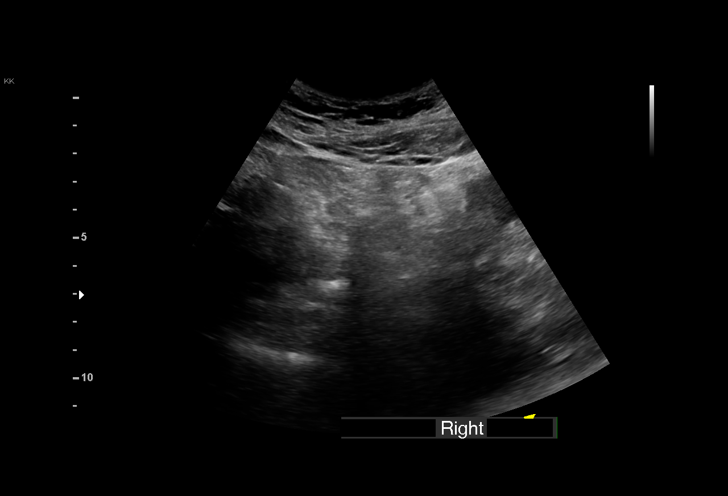
[im 35/119]
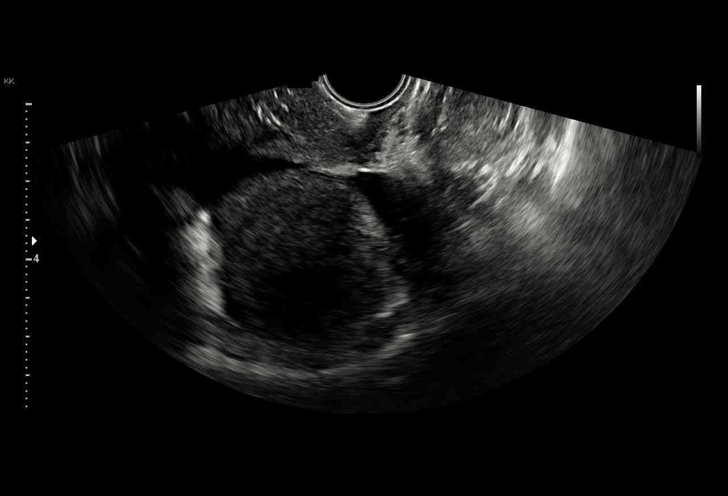
[im 45/119]
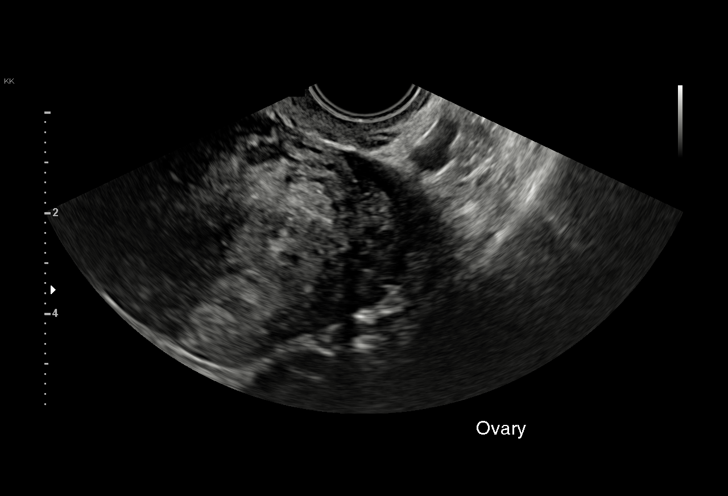
[im 50/119]
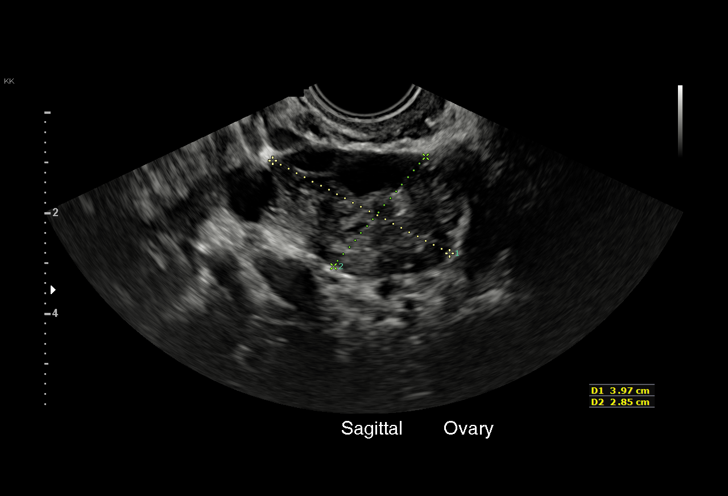
[im 60/119]
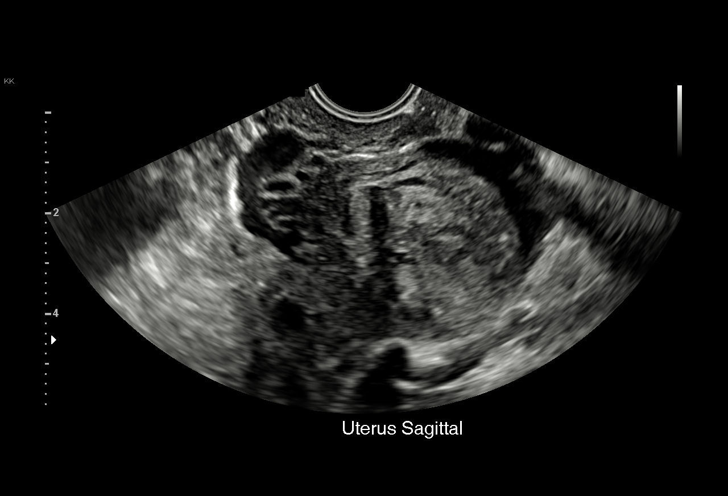
[im 69/119]
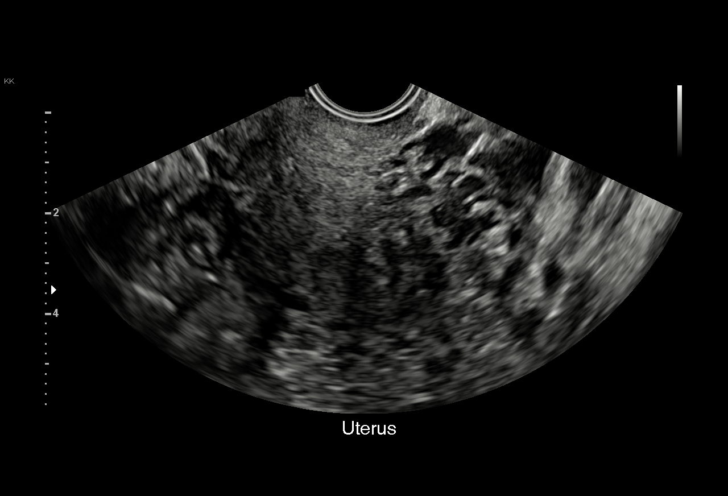
[im 74/119]
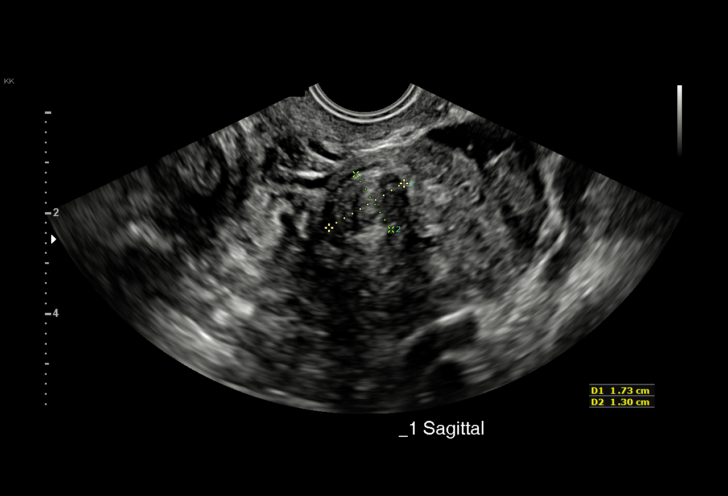
[im 84/119]
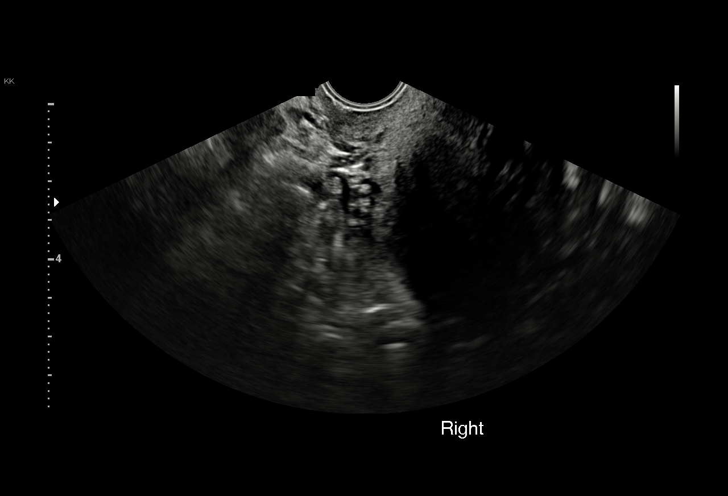
[im 94/119]
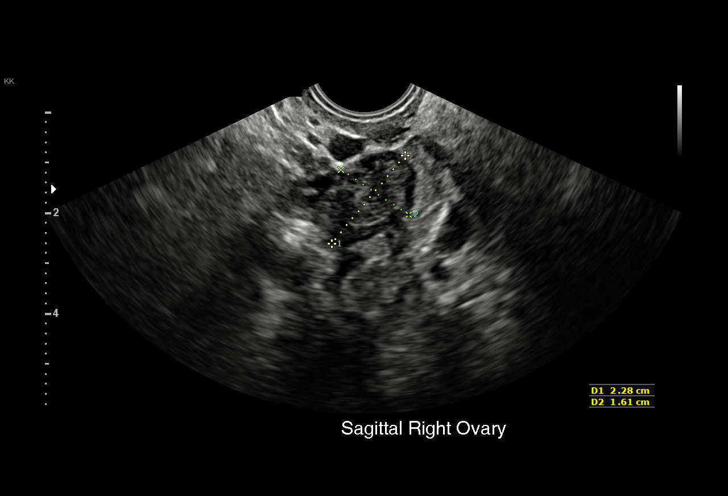
[im 99/119]
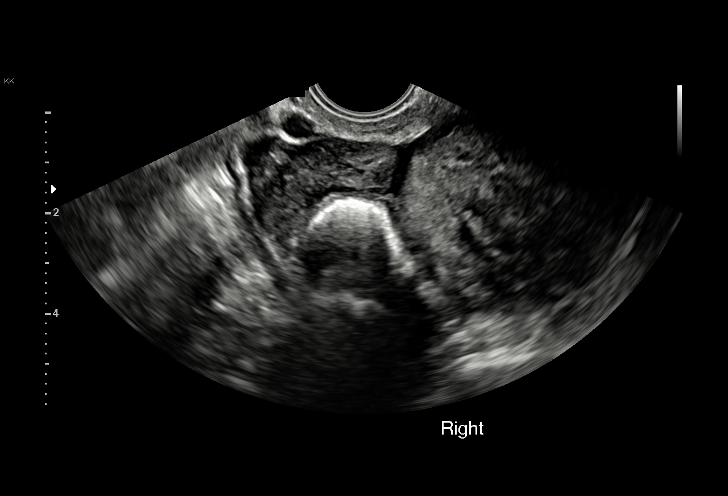
[im 109/119]
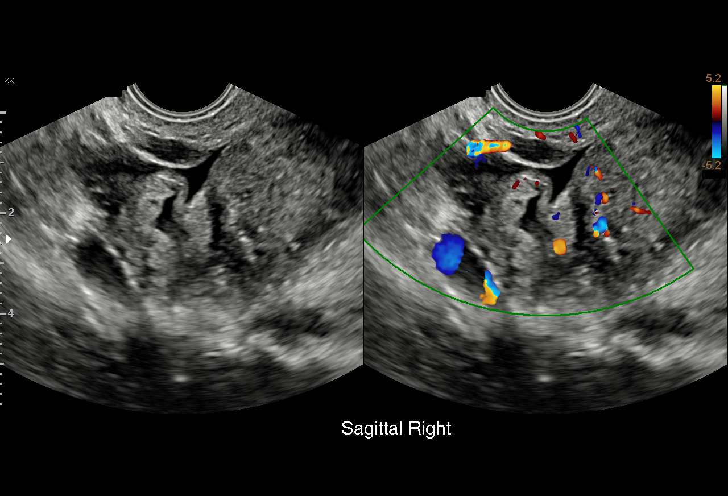
[im 119/119]
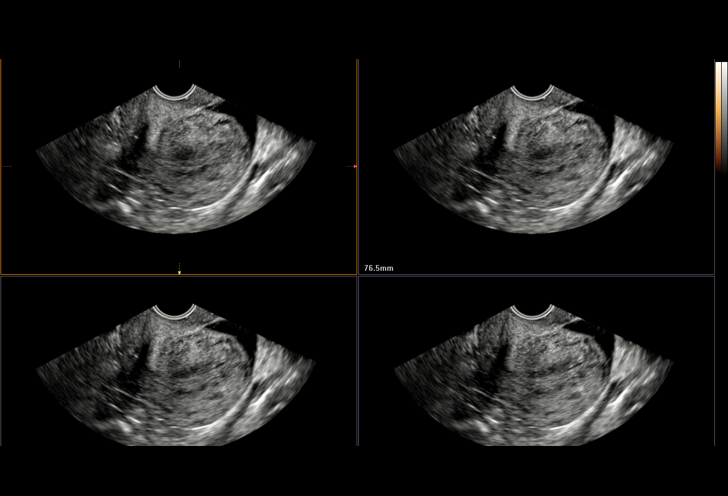

[15 of 25 positions shown; findings below may reference images not displayed]

FINDINGS: Uterus

Measurements: 9.3 x 4.6 x 5.3 cm. Retroverted. Single small
intramural leiomyoma posterior LEFT uterus 1.7 x 1.3 x 1.7 cm

Endometrium

Thickness: 5 mm thick, normal. No endometrial fluid or focal
abnormality

Right ovary

Measurements: 2.3 x 1.6 x 2.8 cm. Normal morphology without mass.
Internal blood flow present on color Doppler imaging.

Left ovary

Measurements: 4.0 x 2.9 x 1.3 cm. Normal morphology without mass.
Internal blood flow present on color Doppler imaging.

Other findings

Small amount of nonspecific free pelvic fluid. No definite adnexal
masses.
IMPRESSION: Single small probable uterine leiomyoma 1.7 cm greatest size.

Otherwise negative exam.

## 2018-09-14 ENCOUNTER — Ambulatory Visit (HOSPITAL_COMMUNITY)
Admission: RE | Admit: 2018-09-14 | Discharge: 2018-09-14 | Disposition: A | Discharge status: Home / Self Care | Payer: Self-pay | Attending: Psychiatry | Admitting: Psychiatry

## 2018-09-14 DIAGNOSIS — F17210 Nicotine dependence, cigarettes, uncomplicated: Secondary | ICD-10-CM | POA: Insufficient documentation

## 2018-09-14 DIAGNOSIS — Z882 Allergy status to sulfonamides status: Secondary | ICD-10-CM | POA: Insufficient documentation

## 2018-09-14 DIAGNOSIS — F319 Bipolar disorder, unspecified: Secondary | ICD-10-CM | POA: Insufficient documentation

## 2018-09-14 DIAGNOSIS — F209 Schizophrenia, unspecified: Secondary | ICD-10-CM | POA: Insufficient documentation

## 2018-09-14 NOTE — H&P (Signed)
Behavioral Health Medical Screening Exam  Christine Wyatt is an 48 y.o. female.  Total Time spent with patient: 20 minutes  Psychiatric Specialty Exam: Physical Exam  Nursing note and vitals reviewed. Constitutional: She is oriented to person, place, and time. She appears well-developed and well-nourished.  Cardiovascular: Normal rate.  Respiratory: Effort normal.  Musculoskeletal: Normal range of motion.  Neurological: She is alert and oriented to person, place, and time.  Skin: Skin is warm.    Review of Systems  Constitutional: Negative.   HENT: Negative.   Eyes: Negative.   Respiratory: Negative.   Cardiovascular: Negative.   Gastrointestinal: Negative.   Genitourinary: Negative.   Musculoskeletal: Negative.   Skin: Negative.   Neurological: Negative.   Endo/Heme/Allergies: Negative.   Psychiatric/Behavioral: Positive for depression and hallucinations (chronic hallucionations for more then a decade). Negative for substance abuse and suicidal ideas.    Blood pressure (!) 145/95, pulse 80, temperature 98.6 F (37 C), resp. rate 16, SpO2 100 %.There is no height or weight on file to calculate BMI.  General Appearance: Casual  Eye Contact:  Fair  Speech:  Clear and Coherent and Normal Rate  Volume:  Normal  Mood:  Angry  Affect:  Flat  Thought Process:  Coherent and Descriptions of Associations: Intact  Orientation:  Full (Time, Place, and Person)  Thought Content:  WDL  Suicidal Thoughts:  No  Homicidal Thoughts:  No  Memory:  Immediate;   Good Recent;   Good Remote;   Good  Judgement:  Fair  Insight:  Fair  Psychomotor Activity:  Normal  Concentration: Concentration: Good and Attention Span: Good  Recall:  Good  Fund of Knowledge:Good  Language: Good  Akathisia:  No  Handed:  Right  AIMS (if indicated):     Assets:  Communication Skills Desire for Improvement Housing Social Support  Sleep:       Musculoskeletal: Strength & Muscle Tone: within  normal limits Gait & Station: normal Patient leans: N/A  Blood pressure (!) 145/95, pulse 80, temperature 98.6 F (37 C), resp. rate 16, SpO2 100 %.  Recommendations:  Based on my evaluation the patient does not appear to have an emergency medical condition.  [place], FNP 09/14/2018, 5:04 PM
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
## 2019-02-24 ENCOUNTER — Other Ambulatory Visit: Payer: Self-pay

## 2019-02-24 ENCOUNTER — Emergency Department (HOSPITAL_COMMUNITY)
Admission: EM | Admit: 2019-02-24 | Discharge: 2019-02-24 | Disposition: A | Discharge status: Home / Self Care | Payer: Self-pay | Attending: Emergency Medicine | Admitting: Emergency Medicine

## 2019-02-24 ENCOUNTER — Encounter (HOSPITAL_COMMUNITY): Payer: Self-pay

## 2019-02-24 DIAGNOSIS — F17200 Nicotine dependence, unspecified, uncomplicated: Secondary | ICD-10-CM | POA: Insufficient documentation

## 2019-02-24 DIAGNOSIS — Z5900 Homelessness unspecified: Secondary | ICD-10-CM

## 2019-02-24 DIAGNOSIS — Z79899 Other long term (current) drug therapy: Secondary | ICD-10-CM | POA: Insufficient documentation

## 2019-02-24 DIAGNOSIS — F332 Major depressive disorder, recurrent severe without psychotic features: Secondary | ICD-10-CM | POA: Insufficient documentation

## 2019-02-24 DIAGNOSIS — F419 Anxiety disorder, unspecified: Secondary | ICD-10-CM | POA: Insufficient documentation

## 2019-02-24 DIAGNOSIS — Z046 Encounter for general psychiatric examination, requested by authority: Secondary | ICD-10-CM | POA: Insufficient documentation

## 2019-02-24 DIAGNOSIS — R45851 Suicidal ideations: Secondary | ICD-10-CM | POA: Insufficient documentation

## 2019-02-24 LAB — CBC
HCT: 43.4 % (ref 36.0–46.0)
Hemoglobin: 13.3 g/dL (ref 12.0–15.0)
MCH: 25.1 pg — ABNORMAL LOW (ref 26.0–34.0)
MCHC: 30.6 g/dL (ref 30.0–36.0)
MCV: 82 fL (ref 80.0–100.0)
Platelets: 390 10*3/uL (ref 150–400)
RBC: 5.29 MIL/uL — ABNORMAL HIGH (ref 3.87–5.11)
RDW: 17 % — ABNORMAL HIGH (ref 11.5–15.5)
WBC: 8.6 10*3/uL (ref 4.0–10.5)
nRBC: 0 % (ref 0.0–0.2)

## 2019-02-24 LAB — RAPID URINE DRUG SCREEN, HOSP PERFORMED
Amphetamines: POSITIVE — AB
Barbiturates: NOT DETECTED
Benzodiazepines: NOT DETECTED
Cocaine: POSITIVE — AB
Opiates: NOT DETECTED
Tetrahydrocannabinol: POSITIVE — AB

## 2019-02-24 LAB — COMPREHENSIVE METABOLIC PANEL WITH GFR
ALT: 13 U/L (ref 0–44)
AST: 17 U/L (ref 15–41)
Albumin: 4.4 g/dL (ref 3.5–5.0)
Alkaline Phosphatase: 75 U/L (ref 38–126)
Anion gap: 11 (ref 5–15)
BUN: 10 mg/dL (ref 6–20)
CO2: 20 mmol/L — ABNORMAL LOW (ref 22–32)
Calcium: 9.5 mg/dL (ref 8.9–10.3)
Chloride: 108 mmol/L (ref 98–111)
Creatinine, Ser: 1.02 mg/dL — ABNORMAL HIGH (ref 0.44–1.00)
GFR calc Af Amer: >60 mL/min
GFR calc non Af Amer: >60 mL/min
Glucose, Bld: 113 mg/dL — ABNORMAL HIGH (ref 70–99)
Potassium: 3.1 mmol/L — ABNORMAL LOW (ref 3.5–5.1)
Sodium: 139 mmol/L (ref 135–145)
Total Bilirubin: 0.5 mg/dL (ref 0.3–1.2)
Total Protein: 8.2 g/dL — ABNORMAL HIGH (ref 6.5–8.1)

## 2019-02-24 LAB — ETHANOL: Alcohol, Ethyl (B): <10 mg/dL (ref <10)

## 2019-02-24 LAB — ACETAMINOPHEN LEVEL: Acetaminophen (Tylenol), Serum: <10 ug/mL — ABNORMAL LOW (ref 10–30)

## 2019-02-24 LAB — I-STAT BETA HCG BLOOD, ED (MC, WL, AP ONLY): I-stat hCG, quantitative: <5 m[IU]/mL (ref <5)

## 2019-02-24 LAB — SALICYLATE LEVEL: Salicylate Lvl: <7 mg/dL (ref 2.8–30.0)

## 2019-02-24 MED ORDER — ACETAMINOPHEN 325 MG PO TABS: 650.0000 mg | ORAL_TABLET | ORAL | Status: DC | PRN

## 2019-02-24 MED ORDER — ONDANSETRON HCL 4 MG PO TABS: 4.0000 mg | ORAL_TABLET | Freq: Three times a day (TID) | ORAL | Status: DC | PRN

## 2019-02-24 NOTE — ED Notes (Signed)
Report on Situation, Background, Assessment, and Recommendations received from [place], [place] in triage. Patient transferred to room 29 in TCU. Patient alert and in no visible distress. Patient made aware 1:1 observer and security cameras for their safety. Patient instructed to come to me with needs or concerns. Pt. Given orientation of the unit and room.

## 2019-02-24 NOTE — BH Assessment (Signed)
Patient seen my TTS for crisis assessment. Report suicidal thoughts with no and auditory hallucinations of hearing her own voice. Denies homicidal ideation, denied visual hallucinations.   Disposition:  Ricky Ala, NP, recommend overnight observation

## 2019-02-24 NOTE — ED Provider Notes (Addendum)
[facility] DEPT Provider Note   CSN: [id number] Arrival date & time: 02/24/19  1201    History   Chief Complaint Chief Complaint  Patient presents with  . Suicidal    HPI Christine Wyatt is a 48 y.o. female.     HPI  48 year old female with history of anxiety, depression comes in a chief complaint of SI. She reports that she has been taking the Seroquel.  Patient has been homeless for the last several days, and states that a makeshift camp.  At that site there is a lot of screaming and yelling -and she is getting more more anxious due to the COVID-19 situation leading her to have suicidal thoughts without a plan.  She has not required a psychiatric admission and a long time.  Pt denies nausea, emesis, fevers, chills, chest pains, shortness of breath, headaches, abdominal pain, uti like symptoms.   Past Medical History:  Diagnosis Date  . Anxiety   . Bipolar 1 disorder ([place])   . Depression   . Fibroids 11/10/2015  . Genital herpes   . Overdose drug   . UTI (lower urinary tract infection)     Patient Active Problem List   Diagnosis Date Noted  . Bipolar 1 disorder, depressed ([place]) 11/10/2015  . Tobacco abuse 11/10/2015  . Fibroids 11/10/2015  . Tooth pain 11/10/2015  . Epigastric pain 11/10/2015  . Health care maintenance 11/10/2015    Past Surgical History:  Procedure Laterality Date  . CESAREAN SECTION       OB History    Gravida  2   Para  1   Term  1   Preterm  0   AB  1   Living  1     SAB  0   TAB  1   Ectopic  0   Multiple  0   Live Births               Home Medications    Prior to Admission medications   Medication Sig Start Date End Date Taking? Authorizing Provider  QUEtiapine (SEROQUEL) 200 MG tablet Take 200 mg by mouth at bedtime.  10/10/18  Yes [provider]  ciprofloxacin (CIPRO) 250 MG tablet Take 1 tablet (250 mg total) by mouth every 12 (twelve) hours. Patient not  taking: Reported on 06/26/2017 05/23/17   Luvenia Redden, PA-C  ferrous sulfate 325 (65 FE) MG tablet Take 1 tablet (325 mg total) by mouth daily. Patient not taking: Reported on 06/12/2018 05/23/17   Luvenia Redden, PA-C  megestrol (MEGACE) 20 MG tablet Take 2 tablets (40 mg total) by mouth 2 (two) times daily. Patient not taking: Reported on 02/24/2019 05/23/17   Luvenia Redden, PA-C  pantoprazole (PROTONIX) 40 MG tablet Take 1 tablet (40 mg total) by mouth daily. Patient not taking: Reported on 06/12/2018 11/10/15   Jones Bales, MD    Family History Family History  Problem Relation Age of Onset  . Hypertension Mother     Social History Social History   Tobacco Use  . Smoking status: Current Every Day Smoker  . Smokeless tobacco: Never Used  Substance Use Topics  . Alcohol use: No  . Drug use: No     Allergies   Sulfa antibiotics   Review of Systems Review of Systems  Constitutional: Positive for activity change.  Respiratory: Negative for shortness of breath.   Cardiovascular: Negative for chest pain.  Gastrointestinal: Negative for abdominal pain.  Psychiatric/Behavioral: Positive for suicidal ideas.     Physical Exam Updated Vital Signs BP (!) 141/113 (BP Location: Right Arm)   Pulse 85   Temp 98.6 F (37 C) (Oral)   Resp 15   Ht 5' 2.25" (1.581 m)   Wt 82.3 kg   LMP 12/30/2018   SpO2 99%   BMI 32.91 kg/m   Physical Exam Vitals signs and nursing note reviewed.  Constitutional:      Appearance: She is well-developed.  HENT:     Head: Normocephalic and atraumatic.  Neck:     Musculoskeletal: Normal range of motion and neck supple.  Cardiovascular:     Rate and Rhythm: Normal rate.  Pulmonary:     Effort: Pulmonary effort is normal.  Abdominal:     General: Bowel sounds are normal.  Skin:    General: Skin is warm and dry.  Neurological:     Mental Status: She is alert and oriented to person, place, and time.  Psychiatric:     Comments:  Tearful, labile emotionally.      ED Treatments / Results  Labs (all labs ordered are listed, but only abnormal results are displayed) Labs Reviewed  COMPREHENSIVE METABOLIC PANEL - Abnormal; Notable for the following components:      Result Value   Potassium 3.1 (*)    CO2 20 (*)    Glucose, Bld 113 (*)    Creatinine, Ser 1.02 (*)    Total Protein 8.2 (*)    All other components within normal limits  ACETAMINOPHEN LEVEL - Abnormal; Notable for the following components:   Acetaminophen (Tylenol), Serum <10 (*)    All other components within normal limits  CBC - Abnormal; Notable for the following components:   RBC 5.29 (*)    MCH 25.1 (*)    RDW 17.0 (*)    All other components within normal limits  RAPID URINE DRUG SCREEN, HOSP PERFORMED - Abnormal; Notable for the following components:   Cocaine POSITIVE (*)    Amphetamines POSITIVE (*)    Tetrahydrocannabinol POSITIVE (*)    All other components within normal limits  ETHANOL  SALICYLATE LEVEL  I-STAT BETA HCG BLOOD, ED ([facility], [facility], [facility] ONLY)  I-STAT BETA HCG BLOOD, ED ([facility], [facility], [facility] ONLY)    EKG None  Radiology No results found.  Procedures Procedures (including critical care time)  Medications Ordered in ED Medications  acetaminophen (TYLENOL) tablet 650 mg (has no administration in time range)  ondansetron (ZOFRAN) tablet 4 mg (has no administration in time range)     Initial Impression / Assessment and Plan / ED Course  I have reviewed the triage vital signs and the nursing notes.  Pertinent labs & imaging results that were available during my care of the patient were reviewed by me and considered in my medical decision making (see chart for details).  Clinical Course as of Feb 24 1752  Sun Feb 24, 2019  1752 Patient wants to leave.  She states that the behavioral health team told her that there can adjust watch her overnight and check her vital signs, and she does not think that is helpful.  She wants to  smoke a cigarette, and nothing but a cigarette is making things worse for her in the ED.  She does not want a nicotine patch and she has declined any medication to calm her anxiety.   She repeats that she does not have any active suicidal thoughts and does not intend to hurt herself.  She will return to the ER if her suicidal ideations get worse.   [AN]    Clinical Course User Index [AN] Varney Biles, MD       48 year old comes in a chief complaint of suicidal thoughts.  She has depression and is homeless.  She does not have good support system.  COVID-19 is adding to her anxiety.  She is having suicidal thoughts without any active plan.  She has no medical complaints and has been cleared for psychiatric evaluation.  Final Clinical Impressions(s) / ED Diagnoses   Final diagnoses:  Suicidal ideation  Severe episode of recurrent major depressive disorder, without psychotic features [facility])  Homelessness  Anxiety    ED Discharge Orders    None       Varney Biles, MD 02/24/19 [place], [place], MD 02/24/19 1753

## 2019-02-24 NOTE — Progress Notes (Signed)
Dr. Kathrynn Humble notified of patient vital signs.

## 2019-02-24 NOTE — Progress Notes (Signed)
Patient seen by Dr. Kathrynn Humble. No additional vital signs necessary per Dr. Kathrynn Humble. Pt. Escorted out of the building by staff after reviewing discharge paperwork that the patient verbalizes understanding.

## 2019-02-24 NOTE — ED Notes (Signed)
Patient belongings placed in locker 29 by ED staff for safety.

## 2019-02-24 NOTE — Progress Notes (Signed)
Pt. Endorses suicidal thoughts, but no plan, and able to contract for safety. Pt. Endorses auditory hallucinations, but does not provide any details.

## 2019-02-24 NOTE — Patient Outreach (Signed)
ED Peer Support Specialist Patient Intake (Complete at intake & 30-60 Day Follow-up)  Name: Christine Wyatt  MRN: [id number]  Age: 48 y.o.   Date of Admission: 02/24/2019  Intake:   Comments:      Primary Reason Admitted: Suicidal / Substance Abuse.   Lab values: Alcohol/ETOH:   Positive UDS?   Amphetamines:   Barbiturates:   Benzodiazepines:   Cocaine:   Opiates:   Cannabinoids:    Demographic information: Gender:   Ethnicity:   Marital Status:   Insurance Status:   Ecologist (Work Neurosurgeon, Physicist, medical, etc.:   Lives with:   Living situation:    Reported Patient History: Patient reported health conditions:   Patient aware of HIV and hepatitis status:    In past year, has patient visited ED for any reason?    Number of ED visits:    Reason(s) for visit:    In past year, has patient been hospitalized for any reason?    Number of hospitalizations:    Reason(s) for hospitalization:    In past year, has patient been arrested?    Number of arrests:    Reason(s) for arrest:    In past year, has patient been incarcerated?    Number of incarcerations:    Reason(s) for incarceration:    In past year, has patient received medication-assisted treatment?    In past year, patient received the following treatments:    In past year, has patient received any harm reduction services?    Did this include any of the following?    In past year, has patient received care from a mental health provider for diagnosis other than SUD?    In past year, is this first time patient has overdosed?    Number of past overdoses:    In past year, is this first time patient has been hospitalized for an overdose?    Number of hospitalizations for overdose(s):    Is patient currently receiving treatment for a mental health diagnosis?    Patient reports experiencing difficulty participating in SUD treatment:      Most important reason(s) for  this difficulty?    Has patient received prior services for treatment?    In past, patient has received services from following agencies:    Plan of Care:  Suggested follow up at these agencies/treatment centers:    Other information: CPSS met with Pt and was able to gain information to better assist Pt. Pt stated that she has been trying to seek help but feels that she cannot do it on her on. Pt stated that she has been using [facility] and that she plans on getting back set up with there services. Pt discussed the fact that she was having a moment and that she does not want to stay at the hospital. CPSS discussed the importance of allowing the facility to help her, an not get frustrated. CPSS advised Pt follow up with [facility] services.   Aaron Edelman Nazir Hacker, CPSS  02/24/2019 5:12 PM

## 2019-02-24 NOTE — Discharge Instructions (Signed)
We saw you in the ER for your mental health concerns and had our behavioral health team evaluate you.  Please refrain from substance abuse. Return to the ER if your symptoms worsen.   Substance Abuse Treatment Programs  Intensive Outpatient Programs [facility]     [address], [place]       [facility] [address] [place], [place]  [facility]     (Inpatient and outpatient)     [address]           [place] [phone] (Suboxone and Methadone)  [place], [place] [address]      [place] [address] [place], [place]  [facility] [place] [facility] (Outpatient/Inpatient, Chemical)    (insurance only) [phone]             [facility] ([place]) [place], [place]     [facility]     [address]     [place], [place]       [facility] (for caregivers and family) [phone] [address] Kristeen Mans. [place], [place]      Residential Treatment Programs [facility]      [address] [place], [place] [address]  [phone]       [address] [place], [place] [address] [phone]  [facility] [place]        [phone]       [facility] [phone]  [facility] ([place]             [place], [place] or [place] of [place], [address] [phone]  [facility]    [address]      [place], [place]       [facility] [address] [place], [place]  [place]   [address] [place], [place] [address]     [phone]      Admissions: 8am-3pm M-F  [facility] (RTS) [address] [place], [place]  [facility]ATS Program: Residential Program [phone] Days)   [place], [place] or [phone]     ADATC: [facility] [place], [place] (Walk in Hours over the weekend or by referral)  [facility] [place], [place], [place]  [address] [phone]  Crisis Mobile: [facility]:  [phone] (for crisis response 24 hours a day) [facility] Hotline:      3513773851 Outpatient Psychiatry and Counseling  [facility]: Mobile Crisis Management 24 hours:  [phone]  [facility] the [employer] sliding scale fee and walk in schedule: M-F 8am-12pm/1pm-3pm [place], [place] [address] [place], [place] [address] [phone]  [facility] (Formerly [facility] as [facility] [employer])- new patient walk-in appointments available Monday - Friday 8am -3pm.          [address] [place], [place] [address] [phone] or crisis line- [facility]/ Intensive Outpatient Therapy Program [place], [place] [address] [place]      [phone] [address] [place], [place] [address]                 [place]   [facility] [phone]. [place], [place] [address]   [employer] [facility]          [address] Johnette Abraham  [place], [place] [address]       [phone]  [facility] [address] [place], [place] [address] [phone]  [facility]    [address] [place], [place]  [address]     [place], [place] Joycelyn Man     [place] [address]     [phone]       [facility] [place] [address]  [facility]     [address], [place], [place], [place] [address] [facility]     [address]     [place], [place] [address]     [phone]       [facility] [address] [place], [place] [address] [phone] Resources for Temporary Residential Assistance/Crisis [place] [facility]) M-F 8am-3pm   [address], [place] [address]   [phone] Services include: laundry, barbering, support groups, case management, phone  & computer access, showers, AA/NA mtgs, mental health/substance abuse nurse, job skills class, disability information, VA assistance, spiritual classes, etc.   [facility]   [address] [place]     [place]              [employer] (women and children)       [address] [place], [place] [address] [phone] [email or web address] for application and process Application Required  [facility]   [address]    [place] [address]     [phone]                    [place] [address] [place],  [address]  [phone]([email or web address] application appt.) Application Required  [facility] (women only)    [place], [place] [address]     [phone]      Intake starts 6pm daily Need valid ID, SSC, & Police report [employer] [address] [place], [place] [phone] Application Required  [employer] (men only)     [place].       [place], [place]       [place] (Pregnant women only) [address] [place], [place]  [facility]      [place] [address].      [place], [place] [address]     [phone]             [facility] [address] [place], [place] 90 day commitment/SA/Application process  [facility](men only)     [address]     [place], [place]       Check-in at [facility] [address] [place], [place] [address] [phone] Men/Women/Women and Children must be there by 7 pm  [place], [place]
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
## 2019-02-24 NOTE — ED Triage Notes (Signed)
Pt states that she is having suicidal thoughts. Pt does not have a plan. Pt states that she is upset with her living situation- pt is homeless. Pt states that she is stressed because of COVID 19.  Pt also adds that she has right flank pain. No urinary issues.

## 2019-11-08 ENCOUNTER — Emergency Department (HOSPITAL_COMMUNITY)
Admission: EM | Admit: 2019-11-08 | Discharge: 2019-11-08 | Disposition: A | Discharge status: Home / Self Care | Payer: No Typology Code available for payment source | Attending: Emergency Medicine | Admitting: Emergency Medicine

## 2019-11-08 ENCOUNTER — Emergency Department (HOSPITAL_COMMUNITY): Payer: No Typology Code available for payment source

## 2019-11-08 ENCOUNTER — Other Ambulatory Visit: Payer: Self-pay

## 2019-11-08 ENCOUNTER — Encounter (HOSPITAL_COMMUNITY): Payer: Self-pay

## 2019-11-08 DIAGNOSIS — Z79899 Other long term (current) drug therapy: Secondary | ICD-10-CM | POA: Insufficient documentation

## 2019-11-08 DIAGNOSIS — K5790 Diverticulosis of intestine, part unspecified, without perforation or abscess without bleeding: Secondary | ICD-10-CM | POA: Diagnosis not present

## 2019-11-08 DIAGNOSIS — Y92410 Unspecified street and highway as the place of occurrence of the external cause: Secondary | ICD-10-CM | POA: Insufficient documentation

## 2019-11-08 DIAGNOSIS — K760 Fatty (change of) liver, not elsewhere classified: Secondary | ICD-10-CM | POA: Diagnosis not present

## 2019-11-08 DIAGNOSIS — Y999 Unspecified external cause status: Secondary | ICD-10-CM | POA: Diagnosis not present

## 2019-11-08 DIAGNOSIS — I10 Essential (primary) hypertension: Secondary | ICD-10-CM

## 2019-11-08 DIAGNOSIS — M7918 Myalgia, other site: Secondary | ICD-10-CM | POA: Diagnosis not present

## 2019-11-08 DIAGNOSIS — F17210 Nicotine dependence, cigarettes, uncomplicated: Secondary | ICD-10-CM | POA: Insufficient documentation

## 2019-11-08 DIAGNOSIS — Y939 Activity, unspecified: Secondary | ICD-10-CM | POA: Insufficient documentation

## 2019-11-08 DIAGNOSIS — R911 Solitary pulmonary nodule: Secondary | ICD-10-CM

## 2019-11-08 DIAGNOSIS — R0789 Other chest pain: Secondary | ICD-10-CM | POA: Diagnosis not present

## 2019-11-08 DIAGNOSIS — J439 Emphysema, unspecified: Secondary | ICD-10-CM | POA: Diagnosis not present

## 2019-11-08 DIAGNOSIS — R109 Unspecified abdominal pain: Secondary | ICD-10-CM | POA: Diagnosis not present

## 2019-11-08 DIAGNOSIS — S0990XA Unspecified injury of head, initial encounter: Secondary | ICD-10-CM | POA: Diagnosis present

## 2019-11-08 DIAGNOSIS — I700 Atherosclerosis of aorta: Secondary | ICD-10-CM | POA: Diagnosis not present

## 2019-11-08 LAB — I-STAT BETA HCG BLOOD, ED (MC, WL, AP ONLY): I-stat hCG, quantitative: <5 m[IU]/mL (ref <5)

## 2019-11-08 LAB — COMPREHENSIVE METABOLIC PANEL
ALT: 13 U/L (ref 0–44)
AST: 19 U/L (ref 15–41)
Albumin: 4.3 g/dL (ref 3.5–5.0)
Alkaline Phosphatase: 87 U/L (ref 38–126)
Anion gap: 9 (ref 5–15)
BUN: 11 mg/dL (ref 6–20)
CO2: 24 mmol/L (ref 22–32)
Calcium: 8.9 mg/dL (ref 8.9–10.3)
Chloride: 106 mmol/L (ref 98–111)
Creatinine, Ser: 0.66 mg/dL (ref 0.44–1.00)
GFR calc Af Amer: >60 mL/min (ref >60)
GFR calc non Af Amer: >60 mL/min (ref >60)
Glucose, Bld: 97 mg/dL (ref 70–99)
Potassium: 3.8 mmol/L (ref 3.5–5.1)
Sodium: 139 mmol/L (ref 135–145)
Total Bilirubin: 0.5 mg/dL (ref 0.3–1.2)
Total Protein: 7.9 g/dL (ref 6.5–8.1)

## 2019-11-08 LAB — CBC WITH DIFFERENTIAL/PLATELET
Abs Immature Granulocytes: 0.04 10*3/uL (ref 0.00–0.07)
Basophils Absolute: 0.1 10*3/uL (ref 0.0–0.1)
Basophils Relative: 1 %
Eosinophils Absolute: 0.3 10*3/uL (ref 0.0–0.5)
Eosinophils Relative: 3 %
HCT: 44.1 % (ref 36.0–46.0)
Hemoglobin: 14.2 g/dL (ref 12.0–15.0)
Immature Granulocytes: 0 %
Lymphocytes Relative: 29 %
Lymphs Abs: 3.1 10*3/uL (ref 0.7–4.0)
MCH: 29.3 pg (ref 26.0–34.0)
MCHC: 32.2 g/dL (ref 30.0–36.0)
MCV: 91.1 fL (ref 80.0–100.0)
Monocytes Absolute: 0.4 10*3/uL (ref 0.1–1.0)
Monocytes Relative: 4 %
Neutro Abs: 6.9 10*3/uL (ref 1.7–7.7)
Neutrophils Relative %: 63 %
Platelets: 306 10*3/uL (ref 150–400)
RBC: 4.84 MIL/uL (ref 3.87–5.11)
RDW: 14.6 % (ref 11.5–15.5)
WBC: 10.8 10*3/uL — ABNORMAL HIGH (ref 4.0–10.5)
nRBC: 0 % (ref 0.0–0.2)

## 2019-11-08 MED ORDER — NAPROXEN 375 MG PO TABS: 375.0000 mg | ORAL_TABLET | Freq: Two times a day (BID) | ORAL | 0 refills | Status: AC

## 2019-11-08 MED ORDER — HYDROCODONE-ACETAMINOPHEN 5-325 MG PO TABS
1.0000 | ORAL_TABLET | Freq: Once | ORAL | Status: AC
  Administered 2019-11-08: 1 via ORAL
  Filled 2019-11-08: qty 1

## 2019-11-08 MED ORDER — METHOCARBAMOL 750 MG PO TABS: 750.0000 mg | ORAL_TABLET | Freq: Every evening | ORAL | 0 refills | Status: AC | PRN

## 2019-11-08 MED ORDER — SODIUM CHLORIDE (PF) 0.9 % IJ SOLN
INTRAMUSCULAR | Status: AC
  Filled 2019-11-08: qty 50

## 2019-11-08 MED ORDER — FENTANYL CITRATE (PF) 100 MCG/2ML IJ SOLN
50.0000 ug | Freq: Once | INTRAMUSCULAR | Status: AC
  Administered 2019-11-08: 50 ug via INTRAMUSCULAR
  Filled 2019-11-08: qty 2

## 2019-11-08 MED ORDER — IOHEXOL 300 MG/ML  SOLN
100.0000 mL | Freq: Once | INTRAMUSCULAR | Status: AC | PRN
  Administered 2019-11-08: 22:00:00 100 mL via INTRAVENOUS

## 2019-11-08 MED ORDER — AMLODIPINE BESYLATE 5 MG PO TABS: 5.0000 mg | ORAL_TABLET | Freq: Every day | ORAL | 0 refills | Status: AC

## 2019-11-08 NOTE — Discharge Instructions (Addendum)
There were multiple incidental findings on your CT scan that you will need to follow up with your primary doctor about. You should also follow up with your doctor about your elevated blood pressure reading today.   I have started you on a blood pressure medication due to your elevated blood pressure today. Please take as directed.  You were given a prescription for Robaxin which is a muscle relaxer.  You should not drive, work, or operate machinery while taking this medication as it can make you very drowsy.    Please follow up with your primary care provider within 5-7 days for re-evaluation of your symptoms. If you do not have a primary care provider, information for a healthcare clinic has been provided for you to make arrangements for follow up care. Please return to the emergency department for any new or worsening symptoms.
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
